# Patient Record
Sex: Female | Born: 1980
Health system: Southern US, Community
[De-identification: ages and names within clinical notes are randomized; demographics above are authoritative.]

## PROBLEM LIST (undated history)

## (undated) ENCOUNTER — Inpatient Hospital Stay (HOSPITAL_COMMUNITY): Payer: Self-pay

## (undated) DIAGNOSIS — Z9889 Other specified postprocedural states: Secondary | ICD-10-CM

## (undated) DIAGNOSIS — O139 Gestational [pregnancy-induced] hypertension without significant proteinuria, unspecified trimester: Secondary | ICD-10-CM

## (undated) DIAGNOSIS — R51 Headache: Secondary | ICD-10-CM

## (undated) DIAGNOSIS — E663 Overweight: Secondary | ICD-10-CM

## (undated) DIAGNOSIS — R112 Nausea with vomiting, unspecified: Secondary | ICD-10-CM

## (undated) HISTORY — DX: Overweight: E66.3

---

## 1998-01-21 ENCOUNTER — Emergency Department (HOSPITAL_COMMUNITY): Admission: EM | Admit: 1998-01-21 | Discharge: 1998-01-21 | Payer: Self-pay | Admitting: Emergency Medicine

## 2000-04-13 ENCOUNTER — Encounter: Admission: RE | Admit: 2000-04-13 | Discharge: 2000-04-13 | Payer: Self-pay

## 2000-11-27 ENCOUNTER — Other Ambulatory Visit: Admission: RE | Admit: 2000-11-27 | Discharge: 2000-11-27 | Payer: Self-pay | Admitting: Internal Medicine

## 2000-12-01 ENCOUNTER — Other Ambulatory Visit: Admission: RE | Admit: 2000-12-01 | Discharge: 2000-12-01 | Payer: Self-pay | Admitting: Internal Medicine

## 2002-04-22 ENCOUNTER — Emergency Department (HOSPITAL_COMMUNITY): Admission: EM | Admit: 2002-04-22 | Discharge: 2002-04-22 | Payer: Self-pay | Admitting: Emergency Medicine

## 2004-03-10 HISTORY — PX: WISDOM TOOTH EXTRACTION: SHX21

## 2004-11-27 ENCOUNTER — Emergency Department (HOSPITAL_COMMUNITY): Admission: EM | Admit: 2004-11-27 | Discharge: 2004-11-27 | Payer: Self-pay | Admitting: Family Medicine

## 2005-02-04 ENCOUNTER — Other Ambulatory Visit: Admission: RE | Admit: 2005-02-04 | Discharge: 2005-02-04 | Payer: Self-pay | Admitting: Obstetrics and Gynecology

## 2006-04-27 ENCOUNTER — Ambulatory Visit (HOSPITAL_COMMUNITY): Admission: RE | Admit: 2006-04-27 | Discharge: 2006-04-27 | Payer: Self-pay | Admitting: Obstetrics and Gynecology

## 2006-07-24 ENCOUNTER — Inpatient Hospital Stay (HOSPITAL_COMMUNITY): Admission: RE | Admit: 2006-07-24 | Discharge: 2006-07-27 | Payer: Self-pay | Admitting: Obstetrics and Gynecology

## 2006-08-10 ENCOUNTER — Encounter: Payer: Self-pay | Admitting: Family Medicine

## 2007-04-21 ENCOUNTER — Emergency Department (HOSPITAL_COMMUNITY): Admission: EM | Admit: 2007-04-21 | Discharge: 2007-04-21 | Payer: Self-pay | Admitting: Emergency Medicine

## 2007-05-28 ENCOUNTER — Ambulatory Visit: Payer: Self-pay | Admitting: Family Medicine

## 2007-06-11 ENCOUNTER — Encounter: Payer: Self-pay | Admitting: Family Medicine

## 2007-10-27 ENCOUNTER — Encounter: Payer: Self-pay | Admitting: Family Medicine

## 2007-10-27 ENCOUNTER — Encounter: Payer: Self-pay | Admitting: *Deleted

## 2007-10-27 DIAGNOSIS — R1011 Right upper quadrant pain: Secondary | ICD-10-CM | POA: Insufficient documentation

## 2007-10-28 ENCOUNTER — Encounter: Admission: RE | Admit: 2007-10-28 | Discharge: 2007-10-28 | Payer: Self-pay | Admitting: Family Medicine

## 2008-04-06 ENCOUNTER — Ambulatory Visit: Payer: Self-pay | Admitting: Family Medicine

## 2008-04-06 DIAGNOSIS — J329 Chronic sinusitis, unspecified: Secondary | ICD-10-CM | POA: Insufficient documentation

## 2008-06-28 ENCOUNTER — Ambulatory Visit: Payer: Self-pay | Admitting: Family Medicine

## 2008-06-28 DIAGNOSIS — R300 Dysuria: Secondary | ICD-10-CM | POA: Insufficient documentation

## 2008-06-28 LAB — CONVERTED CEMR LAB
Bilirubin Urine: NEGATIVE
Ketones, urine, test strip: NEGATIVE
Nitrite: NEGATIVE
Specific Gravity, Urine: 1.01
Urobilinogen, UA: 0.2

## 2008-12-11 ENCOUNTER — Ambulatory Visit: Payer: Self-pay | Admitting: Family Medicine

## 2008-12-11 DIAGNOSIS — J309 Allergic rhinitis, unspecified: Secondary | ICD-10-CM | POA: Insufficient documentation

## 2009-06-19 ENCOUNTER — Ambulatory Visit: Payer: Self-pay | Admitting: Sports Medicine

## 2009-06-19 DIAGNOSIS — M542 Cervicalgia: Secondary | ICD-10-CM | POA: Insufficient documentation

## 2010-02-28 ENCOUNTER — Ambulatory Visit: Payer: Self-pay | Admitting: Family Medicine

## 2010-04-01 ENCOUNTER — Encounter: Payer: Self-pay | Admitting: Family Medicine

## 2010-04-01 ENCOUNTER — Ambulatory Visit: Admit: 2010-04-01 | Payer: Self-pay

## 2010-04-01 ENCOUNTER — Ambulatory Visit
Admission: RE | Admit: 2010-04-01 | Discharge: 2010-04-01 | Payer: Self-pay | Source: Home / Self Care | Attending: Family Medicine | Admitting: Family Medicine

## 2010-04-02 NOTE — Progress Notes (Unsigned)
Subjective:     Kelsey [name] is a 30 y.o. female who presents for evaluation of sinus pain. Symptoms include: clear rhinorrhea, congestion, facial pain, frequent clearing of the throat, headaches and nasal congestion. Onset of symptoms was 4 weeks ago. Symptoms have been gradually worsening since that time. Past history is significant for no history of pneumonia or bronchitis. Patient is a non-smoker.  The following portions of the patient's history were reviewed and updated as appropriate: past family history, past social history, past surgical history and problem list.  Review of Systems Ears, nose, mouth, throat, and face: positive for facial pain   Objective:    Head: Normocephalic, without obvious abnormality, asymmetric shape, atraumatic  TTP left maxillary sinus. OP clear. Lungs CTA   Assessment:    Acute bacterial sinusitis.    Plan:    clarithromucin and steroids.

## 2010-04-09 NOTE — Assessment & Plan Note (Signed)
Summary: NECK PAIN x 4 DYS   Vital Signs:  Patient profile:   30 year old female Height:      61 inches Weight:      123 pounds BMI:     23.32 BP sitting:   100 / 68  Vitals Entered By: Lillia Pauls CMA (June 19, 2009 8:33 AM)  Primary Provider:  Denny Levy MD   History of Present Illness: 30 year old who comes in with neck pain for about the last week without any known trauma. No radiculopathy, no numbness, tingling, or weakness.   No hx of neck pain. No h/o surgery.  No shoulder pain  focal around post R parascapular  and R post neck   REVIEW OF SYSTEMS  GEN: No systemic complaints, no fevers, chills, sweats, or other acute illnesses MSK: Detailed in the HPI GI: tolerating PO intake without difficulty Neuro: No numbness, parasthesias, or tingling associated. Otherwise the pertinent positives of the ROS are noted above.       Allergies: 1)  ! Sulfa  Past History:  Past medical, surgical, family and social histories (including risk factors) reviewed, and no changes noted (except as noted below).  Past Medical History: Reviewed history from 05/28/2007 and no changes required. intermittent allergic rhinitis  Past Surgical History: Reviewed history from 06/11/2007 and no changes required. C section 2008  Family History: Reviewed history from 06/11/2007 and no changes required. 3 brothers alive and well Mother died at 77 lung cancer Father hi cholesterol  Social History: Reviewed history and no changes required.  Physical Exam  General:  GEN: Well-developed,well-nourished,in no acute distress; alert,appropriate and cooperative throughout examination HEENT: Normocephalic and atraumatic without obvious abnormalities. No apparent alopecia or balding. Ears, externally no deformities PULM: Breathing comfortably in no respiratory distress EXT: No clubbing, cyanosis, or edema PSYCH: Normally interactive. Cooperative during the interview. Pleasant. Friendly and  conversant. Not anxious or depressed appearing. Normal, full affect.  Msk:  Cervical spine: mild loss of ROM at terminal motion, neg spurlings  Some TTP at R paraspinous around c3 and upper trap, spasm   Impression & Recommendations:  Problem # 1:  NECK PAIN (ICD-723.1) Assessment New acute paraspinous spasm and parascapular  Moist heat, massage Reviewed ergs Neck and scapular protocol for rehab reviewed  Her updated medication list for this problem includes:    Diclofenac Sodium 75 Mg Tbec (Diclofenac sodium) .Marland Kitchen... 1 by mouth two times a day    Tizanidine Hcl 4 Mg Tabs (Tizanidine hcl) .Marland Kitchen... 1 by mouth 4 times daily  Complete Medication List: 1)  Fluticasone Propionate 50 Mcg/act Susp (Fluticasone propionate) .... One spray in each nostril two times a day x 1 week then once daily thereafter. 2)  Diclofenac Sodium 75 Mg Tbec (Diclofenac sodium) .Marland Kitchen.. 1 by mouth two times a day 3)  Tizanidine Hcl 4 Mg Tabs (Tizanidine hcl) .Marland Kitchen.. 1 by mouth 4 times daily  Patient Instructions: 1)  Review handouts and rehab  Prescriptions: TIZANIDINE HCL 4 MG TABS (TIZANIDINE HCL) 1 by mouth 4 times daily  #30 x 1   Entered and Authorized by:   Hannah Beat MD   Signed by:   Hannah Beat MD on 06/19/2009   Method used:   Print then Give to Patient   RxID:   [id number] DICLOFENAC SODIUM 75 MG TBEC (DICLOFENAC SODIUM) 1 by mouth two times a day  #60 x 1   Entered and Authorized by:   Hannah Beat MD   Signed by:  Hannah Beat MD on 06/19/2009   Method used:   Print then Give to Patient   RxID:   [id number] TIZANIDINE HCL 4 MG TABS (TIZANIDINE HCL) 1 by mouth 4 times daily  #30 x 1   Entered and Authorized by:   Hannah Beat MD   Signed by:   Hannah Beat MD on 06/19/2009   Method used:   Electronically to        [facility]* (retail)       [address]       [address] Shipping/mailing       [place], [place]  [address]       Ph:  [phone]       Fax: [phone]   RxID:   [id number] DICLOFENAC SODIUM 75 MG TBEC (DICLOFENAC SODIUM) 1 by mouth two times a day  #60 x 1   Entered and Authorized by:   Hannah Beat MD   Signed by:   Hannah Beat MD on 06/19/2009   Method used:   Electronically to        [facility]* (retail)       [address]       [address] Shipping/mailing       [place], [place]  [address]       Ph: [phone]       Fax: [phone]   RxID:   [id number]

## 2010-04-09 NOTE — Assessment & Plan Note (Signed)
Summary: pressure in ear,tcb   Vital Signs:  Patient profile:   30 year old female Weight:      131 pounds Temp:     98.7 degrees F oral Pulse rate:   74 / minute BP sitting:   116 / 77  (right arm)  Vitals Entered By: Arlyss Repress CMA, (December 11, 2008 11:25 AM) CC: left ear pressure and fullness x 4 days. pain during night. Is Patient Diabetic? No Pain Assessment Patient in pain? no        Primary Care Provider:  Denny Levy MD  CC:  left ear pressure and fullness x 4 days. pain during night..  History of Present Illness: 30 year old with:   1. left ear fullness: x 4-5 days, worse at night, + PMHx seasonal allergies, Rx flonase most days. she is concerned because she will be flying this weekend. she denies HA, vision changes, sore throat, sinus pressure, cough, wheeze, fever/chills, N/V/D. has taken Allegra and Claritin in the past with relief. goal for today's visit is to make sure that she does not have ear infection. no PMHx ear infections since childhood.  Habits & Providers  Alcohol-Tobacco-Diet     Tobacco Status: never  Current Medications (verified): 1)  Fluticasone Propionate 50 Mcg/act Susp (Fluticasone Propionate) .... One Spray in Each Nostril Two Times A Day X 1 Week Then Once Daily Thereafter.  Allergies (verified): 1)  ! Sulfa PMH-FH-SH reviewed for relevance  Review of Systems       per HPI, otherwise negative  Physical Exam  General:  Well-developed, well-nourished, in no acute distress; alert, appropriate and cooperative throughout examination. Vitals reviewed. Head:  Normocephalic and atraumatic without obvious abnormalities.  Eyes:  No corneal or conjunctival inflammation noted. EOMI. Perrla. Vision grossly normal. Ears:  External ear exam shows no significant lesions or deformities.  Otoscopic examination reveals clear canals, tympanic membranes are intact bilaterally without bulging, retraction, inflammation or discharge. Hearing is grossly  normal bilaterally. Nose:  Pale and boggy turbinates, clear nasal drainage. Mouth:  Oral mucosa and oropharynx without lesions or exudates.  Teeth in good repair. Neck:  No deformities, masses, or tenderness noted.   Impression & Recommendations:  Problem # 1:  ALLERGIC RHINITIS, SEASONAL (ICD-477.0) Assessment Deteriorated  No red flags. Reassured patient that there is no infection or need for Abx. Rec: adding OTC antihistamine while seasonal allergies are worst. Symptomatic treatment while flying (chewing gum), Motrin.  Orders: [facility]- Est Level  3 ([address])  Complete Medication List: 1)  Fluticasone Propionate 50 Mcg/act Susp (Fluticasone propionate) .... One spray in each nostril two times a day x 1 week then once daily thereafter.  Patient Instructions: 1)  It was nice to see you! 2)  Have a safe trip! 3)  Add an OTC antihistamine (Claritin, Zyrtec, Allegra) to your nasal spray while your allergies are bad.

## 2010-04-11 NOTE — Assessment & Plan Note (Signed)
Summary: sinus infection,mc   Primary Care Provider:  Denny Levy MD   History of Present Illness: 30 y.o.  Kelsey [name] is a 30 y.o. female who presents for evaluation of sinus pain. Symptoms include: clear rhinorrhea, congestion, facial pain, frequent clearing of the throat, headaches and nasal congestion. Onset of symptoms was 4 weeks ago. Symptoms have been gradually worsening since that time. Past history is significant for no history of pneumonia or bronchitis. Patient is a non-smoker.  The following portions of the patient's history were reviewed and updated as appropriate: past family history, past social history, past surgical history and problem list.  Review of Systems Ears, nose, mouth, throat, and face: positive for facial pain   Objective:   Head: Normocephalic, without obvious abnormality, asymmetric shape, atraumatic  TTP left maxillary sinus. OP clear. Lungs CTA   Assessment:   Acute bacterial sinusitis.    Plan:   clarithromucin and steroids.   Allergies: 1)  ! Sulfa   Impression & Recommendations:  Problem # 1:  SINUSITIS, ACUTE (ICD-461.9) not resolving after 4 weeks  Complete Medication List: 1)  Fluticasone Propionate 50 Mcg/act Susp (Fluticasone propionate) .... One spray in each nostril two times a day x 1 week then once daily thereafter. 2)  Diclofenac Sodium 75 Mg Tbec (Diclofenac sodium) .Marland Kitchen.. 1 by mouth two times a day 3)  Tizanidine Hcl 4 Mg Tabs (Tizanidine hcl) .Marland Kitchen.. 1 by mouth 4 times daily 4)  Augmentin 500-125 Mg Tabs (Amoxicillin-pot clavulanate) .... Take one tab two times a day for 10 days 5)  Prednisone 50 Mg Tabs (Prednisone) .Marland Kitchen.. 1 by mouth once daily for five days 6)  Biaxin Xl 500 Mg Xr24h-tab (Clarithromycin) .... 2 by mouth qd Prescriptions: BIAXIN XL 500 MG XR24H-TAB (CLARITHROMYCIN) 2 by mouth qd  #14 x 1   Entered and Authorized by:   Denny Levy MD   Signed by:   Denny Levy MD on 04/01/2010   Method used:   Electronically to      [facility]* (retail)       [address]       [address] Shipping/mailing       [place], [place]  [address]       Ph: [phone]       Fax: [phone]   RxID:   [id number] PREDNISONE 50 MG TABS (PREDNISONE) 1 by mouth once daily for five days  #5 x 0   Entered and Authorized by:   Denny Levy MD   Signed by:   Denny Levy MD on 04/01/2010   Method used:   Electronically to        [facility]* (retail)       [address]       [address] Shipping/mailing       [place], [place]  [address]       Ph: [phone]       Fax: [phone]   RxID:   [id number]    Orders Added: 1)  Est. Patient Level IV [[address]]     Impression & Recommendations:  Her updated medication list for this problem includes:    Fluticasone Propionate 50 Mcg/act Susp (Fluticasone propionate) ..... One spray in each nostril two times a day x 1 week then once daily thereafter.    Augmentin 500-125 Mg Tabs (Amoxicillin-pot clavulanate) .Marland Kitchen... Take one tab two times a day for 10 days    Biaxin Xl 500 Mg Xr24h-tab (  Clarithromycin) .Marland Kitchen... 2 by mouth qd   Complete Medication List: 1)  Fluticasone Propionate 50 Mcg/act Susp (Fluticasone propionate) .... One spray in each nostril two times a day x 1 week then once daily thereafter. 2)  Diclofenac Sodium 75 Mg Tbec (Diclofenac sodium) .Marland Kitchen.. 1 by mouth two times a day 3)  Tizanidine Hcl 4 Mg Tabs (Tizanidine hcl) .Marland Kitchen.. 1 by mouth 4 times daily 4)  Augmentin 500-125 Mg Tabs (Amoxicillin-pot clavulanate) .... Take one tab two times a day for 10 days 5)  Prednisone 50 Mg Tabs (Prednisone) .Marland Kitchen.. 1 by mouth once daily for five days 6)  Biaxin Xl 500 Mg Xr24h-tab (Clarithromycin) .... 2 by mouth qd

## 2010-04-11 NOTE — Assessment & Plan Note (Signed)
Summary: Sinus Infection/kf   Vital Signs:  Patient profile:   30 year old female Height:      61 inches Weight:      129 pounds BMI:     24.46 Temp:     98.4 degrees F oral Pulse rate:   82 / minute BP sitting:   115 / 56  (left arm) Cuff size:   regular  Vitals Entered By: Tessie Fass CMA (February 28, 2010 2:22 PM) CC: sinus infection   Primary Care Lilliana Turner:  Denny Levy MD  CC:  sinus infection.  History of Present Illness: 4 days of sinus pressure, post nasal drip.  hard to sleep b/c of the post nasal drip.  taking mucinex and delsym.  denies fever, chills, sore throat.  Current Medications (verified): 1)  Fluticasone Propionate 50 Mcg/act Susp (Fluticasone Propionate) .... One Spray in Each Nostril Two Times A Day X 1 Week Then Once Daily Thereafter. 2)  Diclofenac Sodium 75 Mg Tbec (Diclofenac Sodium) .Marland Kitchen.. 1 By Mouth Two Times A Day 3)  Tizanidine Hcl 4 Mg Tabs (Tizanidine Hcl) .Marland Kitchen.. 1 By Mouth 4 Times Daily 4)  Augmentin 500-125 Mg Tabs (Amoxicillin-Pot Clavulanate) .... Take One Tab Two Times A Day For 10 Days  Allergies (verified): 1)  ! Sulfa  Review of Systems  The patient denies anorexia, fever, hoarseness, prolonged cough, and headaches.    Physical Exam  General:  NAD vs reviewed Head:  normocephalic and atraumatic.  tender to percussion of maxillary sinuses Ears:  R ear normal and L ear normal.   Nose:  no external erythema and no nasal discharge.   Lungs:  Normal respiratory effort, chest expands symmetrically. Lungs are clear to auscultation, no crackles or wheezes.   Impression & Recommendations:  Problem # 1:  SINUSITIS, CHRONIC (ICD-473.9) Assessment Deteriorated will treat with nasal steroid and conservative comfort measures.  gave script for abx as pt is leaving town.  advised to use if continues to worsen/fever/lasts longer than ten days.  sudafed, tylenol recommended Her updated medication list for this problem includes:    Fluticasone  Propionate 50 Mcg/act Susp (Fluticasone propionate) ..... One spray in each nostril two times a day x 1 week then once daily thereafter.    Augmentin 500-125 Mg Tabs (Amoxicillin-pot clavulanate) .Marland Kitchen... Take one tab two times a day for 10 days  Orders: [facility]- Est Level  3 ([address])  Complete Medication List: 1)  Fluticasone Propionate 50 Mcg/act Susp (Fluticasone propionate) .... One spray in each nostril two times a day x 1 week then once daily thereafter. 2)  Diclofenac Sodium 75 Mg Tbec (Diclofenac sodium) .Marland Kitchen.. 1 by mouth two times a day 3)  Tizanidine Hcl 4 Mg Tabs (Tizanidine hcl) .Marland Kitchen.. 1 by mouth 4 times daily 4)  Augmentin 500-125 Mg Tabs (Amoxicillin-pot clavulanate) .... Take one tab two times a day for 10 days  Patient Instructions: 1)  I'm sorry you are feeling poorly, especially over the holidays. 2)  Please try the nasal steroid until 7 days after you feel better. 3)  Also, I will give you a perscription for antibiotics to take if you get a fever, feel worse, or are no better in 10 days. 4)  Acute sinusitis symptoms for less than 10 days are not helped by antibiotics. Use warm moist compresses, and over the counter decongestants( only as directed). Call if no improvement in 5-7 days, sooner if increasing pain, fever, or new symptoms.  Prescriptions: AUGMENTIN 500-125 MG TABS (AMOXICILLIN-POT CLAVULANATE)  take one tab two times a day for 10 days  #20 x 0   Entered and Authorized by:   Ellery Plunk MD   Signed by:   Ellery Plunk MD on 02/28/2010   Method used:   Print then Give to Patient   RxID:   [id number] FLUTICASONE PROPIONATE 50 MCG/ACT SUSP (FLUTICASONE PROPIONATE) One spray in each nostril two times a day x 1 week then once daily thereafter.  #1 x 6   Entered and Authorized by:   Ellery Plunk MD   Signed by:   Ellery Plunk MD on 02/28/2010   Method used:   Electronically to        [facility]* (retail)       [address]       [address] Shipping/mailing       [place], [place]  [address]       Ph: [phone]       Fax: [phone]   RxID:   [id number]    Orders Added: 1)  [facility]- Est Level  3 [[address]]

## 2010-04-18 ENCOUNTER — Encounter: Payer: Self-pay | Admitting: *Deleted

## 2010-04-29 ENCOUNTER — Encounter: Payer: Self-pay | Admitting: Family Medicine

## 2010-04-29 ENCOUNTER — Ambulatory Visit (INDEPENDENT_AMBULATORY_CARE_PROVIDER_SITE_OTHER): Payer: BC Managed Care – PPO | Admitting: Family Medicine

## 2010-04-29 DIAGNOSIS — M654 Radial styloid tenosynovitis [de Quervain]: Secondary | ICD-10-CM

## 2010-05-07 NOTE — Assessment & Plan Note (Signed)
Summary: CARPAL TUNNEL SYMPTOMS   Vital Signs:  Patient profile:   30 year old female Pulse rate:   82 / minute BP sitting:   104 / 68  (right arm)  Vitals Entered By: Rochele Pages RN (April 29, 2010 4:14 PM) CC: bilat wrist pain- radial side   Primary Care Provider:  Denny Levy MD  CC:  bilat wrist pain- radial side.  History of Present Illness: B thumb / wrist pain worsening over last few weeks. Types a lot but no change in tthat. pain is along radial side of thumb, right worse than left. aching and some burning.   PERTINENT PMH/PSH: no specific injury, no prior issues w thumbs or wrists. no prior surgeries thumbs or wrists.  Habits & Providers  Alcohol-Tobacco-Diet     Tobacco Status: never  Current Medications (verified): 1)  Fluticasone Propionate 50 Mcg/act Susp (Fluticasone Propionate) .... One Spray in Each Nostril Two Times A Day X 1 Week Then Once Daily Thereafter. 2)  Diclofenac Sodium 75 Mg Tbec (Diclofenac Sodium) .Marland Kitchen.. 1 By Mouth Two Times A Day 3)  Tizanidine Hcl 4 Mg Tabs (Tizanidine Hcl) .Marland Kitchen.. 1 By Mouth 4 Times Daily 4)  Augmentin 500-125 Mg Tabs (Amoxicillin-Pot Clavulanate) .... Take One Tab Two Times A Day For 10 Days 5)  Prednisone 50 Mg Tabs (Prednisone) .Marland Kitchen.. 1 By Mouth Once Daily For Five Days 6)  Biaxin Xl 500 Mg Xr24h-Tab (Clarithromycin) .... 2 By Mouth Qd  Allergies: 1)  ! Sulfa  Review of Systems  The patient denies fever and weight loss.    Physical Exam  General:  alert, well-developed, well-nourished, and well-hydrated.   Msk:  B thumbs FROM. TTP along APL /EPB esp on righ. Full strength dista;;y neurovascualry intact Wrists B full strengh and FROM in all planes   Impression & Recommendations:  Problem # 1:  DE QUERVAIN'S TENOSYNOVITIS (ICD-727.04) B R>L exercises and RICE instructions. f/u if not improving  Complete Medication List: 1)  Fluticasone Propionate 50 Mcg/act Susp (Fluticasone propionate) .... One spray in each  nostril two times a day x 1 week then once daily thereafter. 2)  Diclofenac Sodium 75 Mg Tbec (Diclofenac sodium) .Marland Kitchen.. 1 by mouth two times a day 3)  Tizanidine Hcl 4 Mg Tabs (Tizanidine hcl) .Marland Kitchen.. 1 by mouth 4 times daily 4)  Augmentin 500-125 Mg Tabs (Amoxicillin-pot clavulanate) .... Take one tab two times a day for 10 days 5)  Prednisone 50 Mg Tabs (Prednisone) .Marland Kitchen.. 1 by mouth once daily for five days 6)  Biaxin Xl 500 Mg Xr24h-tab (Clarithromycin) .... 2 by mouth qd   Orders Added: 1)  Est. Patient Level III [78295]

## 2010-05-13 ENCOUNTER — Encounter: Payer: Self-pay | Admitting: Family Medicine

## 2010-05-13 ENCOUNTER — Ambulatory Visit (INDEPENDENT_AMBULATORY_CARE_PROVIDER_SITE_OTHER): Payer: BC Managed Care – PPO | Admitting: Family Medicine

## 2010-05-13 DIAGNOSIS — M654 Radial styloid tenosynovitis [de Quervain]: Secondary | ICD-10-CM

## 2010-05-21 NOTE — Assessment & Plan Note (Signed)
Summary: APPT 2:15 WRIST F/U,[facility]   Vital Signs:  Patient profile:   30 year old female BP sitting:   106 / 74  Vitals Entered By: Lillia Pauls CMA (May 13, 2010 2:08 PM)  Primary Care Provider:  Denny Levy MD   History of Present Illness: f/u B R>L thumb pain. Absolutely7 no better wit the rest (and she was on vacation so it was true rest).Pain is aching 4/10 at worst.  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  Allergies: 1)  ! Sulfa  Review of Systems  The patient denies fever.    Physical Exam  General:  alert, well-developed, well-nourished, and well-hydrated.   Msk:  + Finklesteins B TTP APL and PBr R> L intact thumb strength and rom Additional Exam:  Patient given informed consent for injection. Discussed possible complications of infection, bleeding or skin atrophy at site of injection. Possible side effect of avascular necrosis (focal area of bone death) due to steroid use.Appropriate verbal time out taken Are cleaned and prepped in usual sterile fashion. A --1/2-- cc kennalog plus --1--cc 1% lidocaine without epinephrine was injected into the-tendon sheath of APL ?PBR rigt--. Patient tolerated procedure well with no complications.    Impression & Recommendations:  Problem # 1:  DE QUERVAIN'S TENOSYNOVITIS (ICD-727.04)  Orders: Thumb Splint (Z6109) Joint Aspirate / Injection, Small (60454) Kenalog 10 mg inj (J3301) right injection thumb splint ice rtc 1-2 w  Complete Medication List: 1)  Fluticasone Propionate 50 Mcg/act Susp (Fluticasone propionate) .... One spray in each nostril two times a day x 1 week then once daily thereafter. 2)  Diclofenac Sodium 75 Mg Tbec (Diclofenac sodium) .Marland Kitchen.. 1 by mouth two times a day 3)  Tizanidine Hcl 4 Mg Tabs (Tizanidine hcl) .Marland Kitchen.. 1 by mouth 4 times daily 4)  Augmentin 500-125 Mg Tabs (Amoxicillin-pot clavulanate) .... Take one tab two  times a day for 10 days 5)  Prednisone 50 Mg Tabs (Prednisone) .Marland Kitchen.. 1 by mouth once daily for five days 6)  Biaxin Xl 500 Mg Xr24h-tab (Clarithromycin) .... 2 by mouth qd   Orders Added: 1)  Thumb Splint [L3999] 2)  Est. Patient Level III [09811] 3)  Joint Aspirate / Injection, Small [20600] 4)  Kenalog 10 mg inj [J3301]

## 2010-07-26 NOTE — Op Note (Signed)
[name], Kelsey [name]                  ACCOUNT NO.:  [id number]   MEDICAL RECORD NO.:  [id number]          PATIENT TYPE:  INP   LOCATION:  9147                          FACILITY:  [facility]   PHYSICIAN:  Zelphia Cairo, MD    DATE OF BIRTH:  [birth year]   DATE OF PROCEDURE:  07/24/2006  DATE OF DISCHARGE:                               OPERATIVE REPORT   PREOPERATIVE DIAGNOSES:  1. Intrauterine pregnancy at 38+ weeks.  2. Breech presentation.   POSTOPERATIVE DIAGNOSES:  1. Intrauterine pregnancy at 38+ weeks.  2. Breech presentation.   OPERATION PERFORMED:  Primary low transverse cesarean delivery.   SURGEON:  Zelphia Cairo, MD   ANESTHESIA:  Spinal.   SPECIMENS:  Placenta to labor and delivery.   ESTIMATED BLOOD LOSS:  600 mL.   COMPLICATIONS:  None.   URINE OUTPUT:  400 mL.   CONDITION:  Stable to recovery room.   DESCRIPTION OF PROCEDURE:  The patient was taken to the operating room  where spinal anesthesia was found to be adequate.  She was placed in the  supine position with a left tilt.  She was prepped and draped in sterile  fashion and a Foley catheter was inserted sterilely.  Pfannenstiel  incision was made with a scalpel and carried down to underlying fascia.  The fascia was incised in the midline and this was extended laterally  using Mayo scissors.  The Kocher clamps were then used to grasp the  anterior portion of the fascia.  This was tented upwards and dissected  off the underlying rectus muscles using a Bovie.  The inferior portion  of the fascia was then grasped, tented upwards and dissected off the  underlying rectus muscles using a Bovie.  The peritoneum was then  identified, tented upwards and entered sharply using Metzenbaum  scissors.  This was extended superiorly and inferiorly with good  visualization of the bladder.  The bladder blade was then inserted.  The  bladder flap was created using Metzenbaum scissors and the bladder blade  was replaced to  protect the bladder flap.   Uterine incision was then made with the scalpel and this was extended  bluntly.  The infant's hips were then grasped and the butt was delivered  through the uterine incision.  The knees were flexed and delivered  through the incision.  The shoulders were then delivered without problem  and the head followed.  The mouth and nose of the infant were suctioned  as the cord was clamped and cut.  The infant was then taken to the  waiting pediatrics staff.   The placenta was then removed from the uterus.  The uterus was cleared  of all clots and debris using a lap sponge.  The uterine incision was  closed with 1-0 chromic in a running locked fashion.  The uterine  incision was found to be hemostatic.  The pelvis was then irrigated with  warm normal saline.  The peritoneum was reapproximated with 0 Monocryl.  The fascia was closed with PDS and the skin was closed with staples.  The patient tolerated  the procedure well.  Sponge, lap, needle and  instruments were all correct times two.  She received antibiotics at  cord clamp.      Zelphia Cairo, MD  Electronically Signed     GA/MEDQ  D:  07/24/2006  T:  07/24/2006  Job:  [id number]

## 2010-07-26 NOTE — Discharge Summary (Signed)
NAMEMERLIN, EGE                  ACCOUNT NO.:  [id number]   MEDICAL RECORD NO.:  [id number]          PATIENT TYPE:  INP   LOCATION:  9147                          FACILITY:  [facility]   PHYSICIAN:  Duke Salvia. Marcelle Overlie, M.D.DATE OF BIRTH:  [birth year]   DATE OF ADMISSION:  07/24/2006  DATE OF DISCHARGE:  07/27/2006                               DISCHARGE SUMMARY   ADMISSION DIAGNOSES:  1. Intrauterine pregnancy at term.  2. Breech presentation.   DISCHARGE DIAGNOSIS:  Status post low transverse cesarean section, a  viable female infant.   PROCEDURE:  Primary low transverse cesarean section.   REASON FOR ADMISSION:  Please see written H&P.   HOSPITAL COURSE:  The patient is 25-year primigravida that was admitted  to [facility] at 30 8-6/7 weeks estimated gestational  age with a breech presenting station.  Due to the breech presentation.  The patient elected to proceed with a primary low transverse cesarean  section. On the morning of admission the patient was taken to operating  room where spinal anesthesia was administered without difficulty.  Low  transverse incision was made with delivery of a viable female infant  weighing 7 pounds 15 ounces Apgars of 8 at 1 and 9 at 5 minutes.  The  patient tolerated procedure well and taken to the recovery room in  stable condition.  On postoperative day #1 the patient was without  complaint.  Vital signs were stable.  She was afebrile.  Abdomen soft  with good return of bowel function.  Abdominal dressing had to be clean,  dry and intact.  Laboratory findings revealed hemoglobin of 11.7,  platelet count 176,000, WBC count of 11.1. On postoperative day #2 the  patient was without complaint.  Vital signs were stable.  Abdomen soft.  Fundus firm and nontender.  Incision was clean, dry and intact.  The  patient was ambulating well and tolerating a regular diet without  complaints of nausea vomiting. On postoperative day #3 the patient  was  without complaint.  Vital signs remained stable.  She is afebrile.  Abdomen soft.  Fundus firm and nontender.  Incision was clean, dry and  intact.  Staples removed.  The patient was later discharged home.   CONDITION ON DISCHARGE:  Good, diet regular as tolerated.   ACTIVITY:  No heavy lifting, no driving x2 weeks, no vaginal entry.   FOLLOW UP:  Patient to follow up in the office in 1-2 weeks for an  incision check.  She is to call for temperature greater than 100  degrees, persistent nausea, vomiting, heavy vaginal bleeding and/or  redness or drainage from incisional site.   DISCHARGE MEDICATIONS:  1. Tylox #30 one p.o. every 4-6 hours.  2. Motrin 600 mg every 6 hours.  3. Prenatal vitamins one p.o. daily.  4. Colace one p.o. daily.      Julio Sicks, N.P.      Richard M. Marcelle Overlie, M.D.  Electronically Signed    CC/MEDQ  D:  09/06/2006  T:  09/07/2006  Job:  [id number]

## 2010-10-04 ENCOUNTER — Encounter (HOSPITAL_COMMUNITY)
Admission: RE | Disposition: A | Discharge status: Home / Self Care | Payer: Self-pay | Source: Ambulatory Visit | Attending: Obstetrics and Gynecology

## 2010-10-04 ENCOUNTER — Encounter (HOSPITAL_COMMUNITY): Payer: Self-pay | Admitting: Anesthesiology

## 2010-10-04 ENCOUNTER — Other Ambulatory Visit: Payer: Self-pay | Admitting: Obstetrics and Gynecology

## 2010-10-04 ENCOUNTER — Encounter (HOSPITAL_COMMUNITY): Payer: Self-pay

## 2010-10-04 ENCOUNTER — Ambulatory Visit (HOSPITAL_COMMUNITY)
Admission: RE | Admit: 2010-10-04 | Discharge: 2010-10-04 | Disposition: A | Discharge status: Home / Self Care | Payer: BC Managed Care – PPO | Source: Ambulatory Visit | Attending: Obstetrics and Gynecology | Admitting: Obstetrics and Gynecology

## 2010-10-04 ENCOUNTER — Ambulatory Visit (HOSPITAL_COMMUNITY): Payer: BC Managed Care – PPO | Admitting: Anesthesiology

## 2010-10-04 DIAGNOSIS — O021 Missed abortion: Secondary | ICD-10-CM | POA: Insufficient documentation

## 2010-10-04 HISTORY — DX: Nausea with vomiting, unspecified: R11.2

## 2010-10-04 HISTORY — PX: DILATION AND EVACUATION: SHX1459

## 2010-10-04 HISTORY — DX: Other specified postprocedural states: Z98.890

## 2010-10-04 LAB — CBC
HCT: 36.1 % (ref 36.0–46.0)
MCH: 32 pg (ref 26.0–34.0)
MCV: 90.3 fL (ref 78.0–100.0)
RBC: 4 MIL/uL (ref 3.87–5.11)

## 2010-10-04 SURGERY — DILATION AND EVACUATION, UTERUS
Anesthesia: Monitor Anesthesia Care | Site: Uterus | Wound class: Clean Contaminated

## 2010-10-04 MED ORDER — DOXYCYCLINE HYCLATE 100 MG PO TABS: 100.0000 mg | ORAL_TABLET | Freq: Two times a day (BID) | ORAL | Status: DC

## 2010-10-04 MED ORDER — PROPOFOL 10 MG/ML IV EMUL
INTRAVENOUS | Status: DC | PRN
  Administered 2010-10-04: 200 ug/kg/min via INTRAVENOUS

## 2010-10-04 MED ORDER — METOCLOPRAMIDE HCL 10 MG PO TABS: 10.0000 mg | ORAL_TABLET | Freq: Once | ORAL | Status: DC | PRN

## 2010-10-04 MED ORDER — METHYLERGONOVINE MALEATE 0.2 MG PO TABS: 0.2000 mg | ORAL_TABLET | Freq: Three times a day (TID) | ORAL | Status: DC

## 2010-10-04 MED ORDER — LIDOCAINE HCL (CARDIAC) 20 MG/ML IV SOLN
INTRAVENOUS | Status: DC | PRN
  Administered 2010-10-04: 50 mg via INTRAVENOUS

## 2010-10-04 MED ORDER — FENTANYL CITRATE 0.05 MG/ML IJ SOLN: 25.0000 ug | INTRAMUSCULAR | Status: DC | PRN

## 2010-10-04 MED ORDER — CHLOROPROCAINE HCL 1 % IJ SOLN
INTRAMUSCULAR | Status: DC | PRN
  Administered 2010-10-04: 10 mL

## 2010-10-04 MED ORDER — KETOROLAC TROMETHAMINE 30 MG/ML IJ SOLN
INTRAMUSCULAR | Status: DC | PRN
  Administered 2010-10-04: 30 mg via INTRAVENOUS

## 2010-10-04 MED ORDER — DEXTROSE-NACL 5-0.9 % IV SOLN: INTRAVENOUS | Status: DC

## 2010-10-04 MED ORDER — FENTANYL CITRATE 0.05 MG/ML IJ SOLN
INTRAMUSCULAR | Status: DC | PRN
  Administered 2010-10-04: 100 ug via INTRAVENOUS

## 2010-10-04 MED ORDER — LIDOCAINE HCL (CARDIAC) 20 MG/ML IV SOLN
INTRAVENOUS | Status: AC
  Filled 2010-10-04: qty 5

## 2010-10-04 MED ORDER — MIDAZOLAM HCL 5 MG/5ML IJ SOLN
INTRAMUSCULAR | Status: DC | PRN
  Administered 2010-10-04: 2 mg via INTRAVENOUS

## 2010-10-04 MED ORDER — SCOPOLAMINE 1 MG/3DAYS TD PT72: 1.0000 | MEDICATED_PATCH | Freq: Once | TRANSDERMAL | Status: DC | PRN

## 2010-10-04 MED ORDER — DEXAMETHASONE SODIUM PHOSPHATE 10 MG/ML IJ SOLN
INTRAMUSCULAR | Status: AC
  Filled 2010-10-04: qty 1

## 2010-10-04 MED ORDER — MIDAZOLAM HCL 2 MG/2ML IJ SOLN
INTRAMUSCULAR | Status: AC
  Filled 2010-10-04: qty 2

## 2010-10-04 MED ORDER — FENTANYL CITRATE 0.05 MG/ML IJ SOLN
INTRAMUSCULAR | Status: AC
  Filled 2010-10-04: qty 2

## 2010-10-04 MED ORDER — DEXAMETHASONE SODIUM PHOSPHATE 10 MG/ML IJ SOLN
INTRAMUSCULAR | Status: DC | PRN
  Administered 2010-10-04: 10 mg via INTRAVENOUS

## 2010-10-04 MED ORDER — CITRIC ACID-SODIUM CITRATE 334-500 MG/5ML PO SOLN: 30.0000 mL | Freq: Once | ORAL | Status: DC | PRN

## 2010-10-04 MED ORDER — KETOROLAC TROMETHAMINE 30 MG/ML IJ SOLN
INTRAMUSCULAR | Status: AC
  Filled 2010-10-04: qty 1

## 2010-10-04 MED ORDER — HYDROCODONE-ACETAMINOPHEN 10-325 MG PO TABS: 1.0000 | ORAL_TABLET | Freq: Four times a day (QID) | ORAL | Status: AC | PRN

## 2010-10-04 MED ORDER — ONDANSETRON HCL 4 MG/2ML IJ SOLN
INTRAMUSCULAR | Status: DC | PRN
  Administered 2010-10-04: 4 mg via INTRAVENOUS

## 2010-10-04 MED ORDER — FAMOTIDINE 20 MG PO TABS: 20.0000 mg | ORAL_TABLET | Freq: Once | ORAL | Status: DC | PRN

## 2010-10-04 MED ORDER — DEXTROSE 5 % IV SOLN
1.0000 g | INTRAVENOUS | Status: AC
  Administered 2010-10-04: 1 g via INTRAVENOUS
  Filled 2010-10-04: qty 1

## 2010-10-04 MED ORDER — DOXYCYCLINE HYCLATE 100 MG PO TABS: 100.0000 mg | ORAL_TABLET | Freq: Two times a day (BID) | ORAL | Status: AC

## 2010-10-04 MED ORDER — PANTOPRAZOLE SODIUM 40 MG PO TBEC: 40.0000 mg | DELAYED_RELEASE_TABLET | Freq: Once | ORAL | Status: DC | PRN

## 2010-10-04 MED ORDER — ONDANSETRON HCL 4 MG/2ML IJ SOLN
INTRAMUSCULAR | Status: AC
  Filled 2010-10-04: qty 2

## 2010-10-04 MED ORDER — PROPOFOL 10 MG/ML IV EMUL
INTRAVENOUS | Status: AC
  Filled 2010-10-04: qty 50

## 2010-10-04 MED ORDER — KETOROLAC TROMETHAMINE 30 MG/ML IJ SOLN: 15.0000 mg | Freq: Once | INTRAMUSCULAR | Status: DC | PRN

## 2010-10-04 MED ORDER — LACTATED RINGERS IV SOLN
INTRAVENOUS | Status: DC
  Administered 2010-10-04 (×3): via INTRAVENOUS

## 2010-10-04 MED ORDER — HYDROCODONE-ACETAMINOPHEN 10-325 MG PO TABS: 1.0000 | ORAL_TABLET | Freq: Four times a day (QID) | ORAL | Status: DC | PRN

## 2010-10-04 SURGICAL SUPPLY — 21 items
CATH ROBINSON RED A/P 16FR (CATHETERS) ×2 IMPLANT
CLOTH BEACON ORANGE TIMEOUT ST (SAFETY) ×2 IMPLANT
DECANTER SPIKE VIAL GLASS SM (MISCELLANEOUS) ×2 IMPLANT
DRAPE UTILITY XL STRL (DRAPES) ×2 IMPLANT
GLOVE BIO SURGEON STRL SZ 6.5 (GLOVE) ×2 IMPLANT
GLOVE BIOGEL PI IND STRL 7.0 (GLOVE) ×1 IMPLANT
GLOVE BIOGEL PI INDICATOR 7.0 (GLOVE) ×1
GOWN PREVENTION PLUS LG XLONG (DISPOSABLE) ×4 IMPLANT
KIT BERKELEY 1ST TRIMESTER 3/8 (MISCELLANEOUS) ×2 IMPLANT
NDL SPNL 22GX3.5 QUINCKE BK (NEEDLE) ×1 IMPLANT
NEEDLE SPNL 22GX3.5 QUINCKE BK (NEEDLE) ×2 IMPLANT
NS IRRIG 1000ML POUR BTL (IV SOLUTION) ×2 IMPLANT
PACK VAGINAL MINOR WOMEN LF (CUSTOM PROCEDURE TRAY) ×2 IMPLANT
PAD PREP 24X48 CUFFED NSTRL (MISCELLANEOUS) ×2 IMPLANT
SET BERKELEY SUCTION TUBING (SUCTIONS) ×2 IMPLANT
SYR CONTROL 10ML LL (SYRINGE) ×2 IMPLANT
TOWEL OR 17X24 6PK STRL BLUE (TOWEL DISPOSABLE) ×4 IMPLANT
VACURETTE 10 RIGID CVD (CANNULA) IMPLANT
VACURETTE 7MM CVD STRL WRAP (CANNULA) IMPLANT
VACURETTE 8 RIGID CVD (CANNULA) ×1 IMPLANT
VACURETTE 9 RIGID CVD (CANNULA) IMPLANT

## 2010-10-04 NOTE — Anesthesia Postprocedure Evaluation (Signed)
  Anesthesia Post-op Note  Patient: Kelsey [name]  Procedure(s) Performed:  DILATATION AND EVACUATION (D&E) - pt last ate at 8:30am  No anesthesia complications.  Level of consciousness: alert. Cardiopulmonary status stable.  No follow-up care or observation required.  Jojo Pehl L. Rodman Pickle, MD

## 2010-10-04 NOTE — Progress Notes (Signed)
Pt discharge instructions contain medications that have been discontinued in the past. Discussed with pt that she is not to take any medications from previous list.  Pt states she has not been taking anything at home.

## 2010-10-04 NOTE — Transfer of Care (Signed)
Immediate Anesthesia Transfer of Care Note  Patient: Kelsey [name]  Procedure(s) Performed:  DILATATION AND EVACUATION (D&E) - pt last ate at 8:30am  Patient Location: PACU  Anesthesia Type: MAC  Level of Consciousness: awake, alert  and oriented  Airway & Oxygen Therapy: Patient Spontanous Breathing and Patient connected to nasal cannula oxygen  Post-op Assessment: Report given to PACU RN and Post -op Vital signs reviewed and stable  Post vital signs: Reviewed and stable  Complications: No apparent anesthesia complications

## 2010-10-04 NOTE — Op Note (Signed)
R/B/A of procedure were d/w patient and informed consent obtained.  Pt was taken to the OR w/ IV running and placed in supine position.  MAC anesthesia was obtained and she was placed in dorsal lithotomy w/ allen stirrups.  Prepped and draped with sterile procedure and in and out catheter used to drain her bladder.  Bivalved speculum was placed in vagina and 1cc of 1% nesicane injected at 12 oclock on the cervix.  Single tooth tenaculum was attached to the anterior lip of the cervix and a cervical block was performed with nesicane.  The cervix was serially dilated with pratt dilators.  8 french suction catheter was used and POC removed from uterus.  A gentle curetting was then performed until uterine cry was noted.  Suction catheter reinserted to remove clots and no more tissue noted.  Tenaculum removed and cervix was hemostatic.  Speculum removed.  All counts were correct.

## 2010-10-04 NOTE — H&P (Signed)
30 yo G2P1 at 10wks by LMP presents for surgical mngt of missed AB and anembryonic pregnancy.  PMHx/PSHx:  c-section x 1 All:  Sulfa Meds:  PNV FHx:  Cancer, heart dz  AF, VSS Gen:  NAD CV:  RRR L:  CTAB Abd - NT, soft PV:  Deferred  [place]:  + IUP at [redacted]wks gestation w/ no CM.  A/P:  Missed Ab.  [place]land for D&E.  R/B/A d/w pt and informed consent.

## 2010-10-04 NOTE — Op Note (Signed)
10/04/2010  3:34 PM  PATIENT:  Kelsey [name]  30 y.o. female  PRE-OPERATIVE DIAGNOSIS: Missed AB  POST-OPERATIVE DIAGNOSIS:  missed abortion  PROCEDURE:  Procedure(s): DILATATION AND EVACUATION (D&E)  SURGEON:  Surgeon(s): Zelphia Cairo  ANESTHESIA:   MAC and local  ESTIMATED BLOOD LOSS: minimal  BLOOD ADMINISTERED:none  DRAINS: none   LOCAL MEDICATIONS USED:  OTHER 1% Nesicane  SPECIMEN: POC  DISPOSITION OF SPECIMEN:  PATHOLOGY  COUNTS:  YES  PLAN OF CARE:d/c home  PATIENT DISPOSITION:  PACU - hemodynamically stable.   Delay start of Pharmacological VTE agent (>24hrs) due to surgical blood loss or risk of bleeding:  no

## 2010-10-04 NOTE — Anesthesia Preprocedure Evaluation (Addendum)
Anesthesia Evaluation  Name, MR# and DOB Patient awake  General Assessment Comment  Reviewed: Allergy & Precautions, H&P , Patient's Chart, lab work & pertinent test results and reviewed documented beta blocker date and time   History of Anesthesia Complications (+) PONV  Airway Mallampati: II TM Distance: >3 FB Neck ROM: Full    Dental No notable dental hx    Pulmonaryneg pulmonary ROS    clear to auscultation    Cardiovascular Regular Normal   Neuro/PsychNegative Neurological ROS Negative Psych ROS  GI/Hepatic/Renal negative GI ROS, negative Liver ROS, and negative Renal ROS (+)       Endo/Other  Negative Endocrine ROS (+)   Abdominal   Musculoskeletal  Hematology negative hematology ROS (+)   Peds negative pediatric ROS (+)  Reproductive/Obstetrics (+) Pregnancy (missed ab)   Anesthesia Other Findings             Anesthesia Physical Anesthesia Plan  ASA: II  Anesthesia Plan: MAC   Post-op Pain Management:    Induction:   Airway Management Planned:   Additional Equipment:   Intra-op Plan:   Post-operative Plan:   Informed Consent: I have reviewed the patients History and Physical, chart, labs and discussed the procedure including the risks, benefits and alternatives for the proposed anesthesia with the patient or authorized representative who has indicated his/her understanding and acceptance.   Dental advisory given  Plan Discussed with:   Anesthesia Plan Comments:       Anesthesia Quick Evaluation

## 2010-10-09 DEATH — deceased

## 2010-11-29 LAB — POCT RAPID STREP A: Streptococcus, Group A Screen (Direct): NEGATIVE

## 2010-12-03 ENCOUNTER — Encounter (HOSPITAL_COMMUNITY): Payer: Self-pay | Admitting: Obstetrics and Gynecology

## 2011-04-30 ENCOUNTER — Inpatient Hospital Stay (HOSPITAL_COMMUNITY)
Admission: AD | Admit: 2011-04-30 | Payer: BC Managed Care – PPO | Source: Ambulatory Visit | Admitting: Obstetrics and Gynecology

## 2011-06-03 LAB — OB RESULTS CONSOLE ANTIBODY SCREEN: Antibody Screen: NEGATIVE

## 2011-06-03 LAB — OB RESULTS CONSOLE HEPATITIS B SURFACE ANTIGEN: Hepatitis B Surface Ag: NEGATIVE

## 2011-06-03 LAB — OB RESULTS CONSOLE ABO/RH: RH Type: POSITIVE

## 2011-07-07 ENCOUNTER — Encounter: Payer: Self-pay | Admitting: Family Medicine

## 2011-08-12 ENCOUNTER — Other Ambulatory Visit: Payer: Self-pay | Admitting: Family Medicine

## 2011-08-12 MED ORDER — AMOXICILLIN 500 MG PO CAPS: 500.0000 mg | ORAL_CAPSULE | Freq: Three times a day (TID) | ORAL | Status: AC

## 2011-08-12 NOTE — Progress Notes (Signed)
[redacted] weeks pregnant Saw her OBGYN yesterday w US showing female  (likely) baby Things going well except 2 weeks increasing sinus pressure, frontal headache, nasal drainage , AM sore throat. No cough, no fever. PERTINENT  PMH / PSH: Chronic sinusitis EXAM: well appearing except for some dark circles under eyes TTP R>L but B maxillary sinuses, Shotty LAD anterior cervical chain Will tx for acute sinusitis w amox and she will let me know if not resolving Congratulated on healthy prehnancy

## 2011-11-19 ENCOUNTER — Other Ambulatory Visit: Payer: Self-pay | Admitting: Family Medicine

## 2011-11-19 DIAGNOSIS — R200 Anesthesia of skin: Secondary | ICD-10-CM

## 2011-11-20 ENCOUNTER — Ambulatory Visit (INDEPENDENT_AMBULATORY_CARE_PROVIDER_SITE_OTHER): Payer: BC Managed Care – PPO | Admitting: Family Medicine

## 2011-11-20 VITALS — BP 135/80 | HR 78 | Resp 12

## 2011-11-20 DIAGNOSIS — M5412 Radiculopathy, cervical region: Secondary | ICD-10-CM

## 2011-11-20 DIAGNOSIS — Z8759 Personal history of other complications of pregnancy, childbirth and the puerperium: Secondary | ICD-10-CM | POA: Insufficient documentation

## 2011-11-20 NOTE — Progress Notes (Signed)
  Subjective:    Patient ID: Kelsey [name], female    DOB: 29-Oct-1980, 31 y.o.   MRN: [id number]  HPI 2 weeks of right arm and hand numbness. Started in the deltoid part of the right arm and has progressively worked its way down so now her index long and ring finger are pretty much totally numb. Her thumb and PT finger are spared. She still has muscular function and is able to type and other activities but the numbness is pretty profound and aggravating. She is currently 33-6/[redacted] weeks pregnant. She has gained a lot of fluid weight particularly in the last one month and her OB/GYN has put her on reduced activity with reduced work hours. She last saw her OB/GYN this week and had a negative urine for protein. She's not had any blood pressure elevations or headaches.  Right-hand dominant.  Pertinent past medical history: No significant neck injury and no neck surgeries.   Review of Systems Has gained some weight with her pregnancy, 9 pounds in the last 2 weeks. She is having a lot of swelling in her extremities, no chest pains or visual changes.    Objective:   Physical Exam  Vital signs reviewed. GENERAL: Well developed, well nourished, no acute distress NEURO: Complete loss of sensation to soft touch in the C6-C7 dermatome. She also has decreased sensation to pinprick in the same dermatomal area. This includes the area at the top of her trapezius on the right. Reflexes his elbow 1+ bilaterally symmetrical. MSK: Strength particularly in muscles innervated by C4 through C8 and T1 I'll have 5 out of 5 strength compared with the other side. SKIN: In the affected area there is no rash, erythema, lesions. NECK: Full range of motion in flexion extension lateral rotation. They have Spurling's. No thyromegaly. No lymphadenopathy.     Assessment & Plan:  #1. Unusual sensory loss. We discussed options. It has certainly progressed in the last 2 weeks. I can't find any reason this will be looked related to  her pregnancy. Given the fact that she's pregnant I don't think we would gain anything by her wrist using plain x-ray or CT scan. I think we need to move forward with cervical MRI and we will set that up. I will let her want to get that back. If she has increasing symptoms particularly if she has weakness she will let me know immediately or go to the emergency room

## 2011-11-24 ENCOUNTER — Ambulatory Visit (HOSPITAL_COMMUNITY)
Admission: RE | Admit: 2011-11-24 | Discharge: 2011-11-24 | Disposition: A | Discharge status: Home / Self Care | Payer: BC Managed Care – PPO | Source: Ambulatory Visit | Attending: Family Medicine | Admitting: Family Medicine

## 2011-11-24 DIAGNOSIS — M542 Cervicalgia: Secondary | ICD-10-CM | POA: Insufficient documentation

## 2011-11-24 DIAGNOSIS — M5020 Other cervical disc displacement, unspecified cervical region: Secondary | ICD-10-CM | POA: Insufficient documentation

## 2011-11-24 DIAGNOSIS — R209 Unspecified disturbances of skin sensation: Secondary | ICD-10-CM | POA: Insufficient documentation

## 2011-11-24 DIAGNOSIS — R200 Anesthesia of skin: Secondary | ICD-10-CM

## 2011-12-17 ENCOUNTER — Encounter (HOSPITAL_COMMUNITY): Payer: Self-pay | Admitting: *Deleted

## 2011-12-17 ENCOUNTER — Encounter (HOSPITAL_COMMUNITY)
Admission: RE | Admit: 2011-12-17 | Discharge: 2011-12-17 | Disposition: A | Discharge status: Home / Self Care | Payer: BC Managed Care – PPO | Source: Ambulatory Visit | Attending: Obstetrics and Gynecology | Admitting: Obstetrics and Gynecology

## 2011-12-17 ENCOUNTER — Encounter (HOSPITAL_COMMUNITY): Payer: Self-pay

## 2011-12-17 ENCOUNTER — Inpatient Hospital Stay (HOSPITAL_COMMUNITY)
Admission: AD | Admit: 2011-12-17 | Discharge: 2011-12-17 | Disposition: A | Discharge status: Home / Self Care | Payer: BC Managed Care – PPO | Source: Ambulatory Visit | Attending: Obstetrics and Gynecology | Admitting: Obstetrics and Gynecology

## 2011-12-17 DIAGNOSIS — R51 Headache: Secondary | ICD-10-CM | POA: Insufficient documentation

## 2011-12-17 DIAGNOSIS — O99891 Other specified diseases and conditions complicating pregnancy: Secondary | ICD-10-CM | POA: Insufficient documentation

## 2011-12-17 HISTORY — DX: Headache: R51

## 2011-12-17 HISTORY — DX: Gestational (pregnancy-induced) hypertension without significant proteinuria, unspecified trimester: O13.9

## 2011-12-17 LAB — CBC
Hemoglobin: 12.4 g/dL (ref 12.0–15.0)
Platelets: 199 10*3/uL (ref 150–400)
RBC: 3.99 MIL/uL (ref 3.87–5.11)
WBC: 10.6 10*3/uL — ABNORMAL HIGH (ref 4.0–10.5)

## 2011-12-17 LAB — COMPREHENSIVE METABOLIC PANEL
ALT: 12 U/L (ref 0–35)
AST: 15 U/L (ref 0–37)
Alkaline Phosphatase: 129 U/L — ABNORMAL HIGH (ref 39–117)
CO2: 22 mEq/L (ref 19–32)
Calcium: 9 mg/dL (ref 8.4–10.5)
Chloride: 100 mEq/L (ref 96–112)
GFR calc Af Amer: >90 mL/min (ref >90)
GFR calc non Af Amer: >90 mL/min (ref >90)
Glucose, Bld: 92 mg/dL (ref 70–99)
Potassium: 3.8 mEq/L (ref 3.5–5.1)
Sodium: 135 mEq/L (ref 135–145)

## 2011-12-17 LAB — URINALYSIS, ROUTINE W REFLEX MICROSCOPIC
Bilirubin Urine: NEGATIVE
Glucose, UA: NEGATIVE mg/dL
Protein, ur: NEGATIVE mg/dL

## 2011-12-17 LAB — SURGICAL PCR SCREEN
MRSA, PCR: NEGATIVE
Staphylococcus aureus: POSITIVE — AB

## 2011-12-17 LAB — URINE MICROSCOPIC-ADD ON

## 2011-12-17 LAB — TYPE AND SCREEN: ABO/RH(D): O POS

## 2011-12-17 LAB — RPR: RPR Ser Ql: NONREACTIVE

## 2011-12-17 MED ORDER — ONDANSETRON 8 MG PO TBDP
8.0000 mg | ORAL_TABLET | Freq: Once | ORAL | Status: AC
  Administered 2011-12-17: 8 mg via ORAL
  Filled 2011-12-17: qty 1

## 2011-12-17 MED ORDER — ONDANSETRON HCL 8 MG PO TABS: 8.0000 mg | ORAL_TABLET | Freq: Three times a day (TID) | ORAL | Status: DC | PRN

## 2011-12-17 MED ORDER — ACETAMINOPHEN 325 MG PO TABS
650.0000 mg | ORAL_TABLET | Freq: Once | ORAL | Status: AC
  Administered 2011-12-17: 650 mg via ORAL
  Filled 2011-12-17: qty 2

## 2011-12-17 NOTE — Patient Instructions (Addendum)
18 Kelsey [name]  12/17/2011   Your procedure is scheduled on:  12/19/11  Enter through the Main Entrance of [facility] at 6 AM.  Pick up the phone at the desk and dial [phone].   Call this number if you have problems the morning of surgery: [phone]   Remember:   Do not eat food:After Midnight.  Do not drink clear liquids: After Midnight.  Take these medicines the morning of surgery with A SIP OF WATER: NA   Do not wear jewelry, make-up or nail polish.  Do not wear lotions, powders, or perfumes. You may wear deodorant.  Do not shave 48 hours prior to surgery.  Do not bring valuables to the hospital.  Contacts, dentures or bridgework may not be worn into surgery.  Leave suitcase in the car. After surgery it may be brought to your room.  For patients admitted to the hospital, checkout time is 11:00 AM the day of discharge.   Patients discharged the day of surgery will not be allowed to drive home.  Name and phone number of your driver: NA  Special Instructions: Shower using CHG 2 nights before surgery and the night before surgery.  If you shower the day of surgery use CHG.  Use special wash - you have one bottle of CHG for all showers.  You should use approximately 1/3 of the bottle for each shower.   Please read over the following fact sheets that you were given: MRSA Information

## 2011-12-17 NOTE — MAU Note (Signed)
Pt sent from office for evaluation of elevated BP and mild pre-e.  Reports that HA and nausea have resolved after zofran and phenergan.  + FM  AF, VSS  BP 130s/70-80s FHT reassuring Gen - NAD Abd - gravid, NT Ext 2+ edema bilaterally, no clonus  Labs reviewed - normal LFTs and plts  A/P:  Pt feeling better, ok for home on bedrest Will schedule rpt c-section for 37wks

## 2011-12-17 NOTE — MAU Note (Signed)
Pt sent from MD office for PIH eval, has had HA since last night, also nauseated.  Denies LOF or bleeding.

## 2011-12-18 NOTE — H&P (Signed)
Kelsey [name], Kelsey [name] NO.:  [id number]  MEDICAL RECORD NO.:  [id number]  LOCATION:  PERIO                         FACILITY:  [facility]  PHYSICIAN:  Zelphia Cairo, MD    DATE OF BIRTH:  April 05, 1980  DATE OF ADMISSION:  10/07/2011 DATE OF DISCHARGE:  12/17/2011                             HISTORY & PHYSICAL   HISTORY OF PRESENT ILLNESS:  A 31 year old, G2, P1 at 37 weeks, presents today for repeat C-section.  Pregnancy has been complicated by mild preeclampsia.  Over the past 2 weeks, she has been managed as an outpatient for elevated blood pressures and mild proteinuria.  Most recent blood pressure was 152/92 with trace proteinuria.  No evidence of HELLP syndrome.  Mildly elevated uric acid was noted on her last blood work.  PAST MEDICAL HISTORY:  Negative.  SURGICAL HISTORY:  C-section and D and E.  SOCIAL HISTORY:  Negative for tobacco, alcohol, and drug use.  FAMILY HISTORY:  Lung cancer, diabetes, heart disease, and chronic hypertension.  PHYSICAL EXAMINATION:  VITAL SIGNS:  She is afebrile.  Blood pressure is 130s to 150s over 80s to 90s. GENERAL:  She is in no acute distress. HEART:  Regular rate and rhythm. LUNGS:  Clear bilaterally. ABDOMEN:  Gravid and nontender.  Pelvic exam shows closed cervix.  ASSESSMENT: 1. Mild preeclampsia. 2. Desires repeat cesarean section. 3. Desires permanent sterilization.  PLAN:  Repeat C-section with tubal ligation.     Zelphia Cairo, MD     GA/MEDQ  D:  12/17/2011  T:  12/18/2011  Job:  [id number]

## 2011-12-19 ENCOUNTER — Encounter (HOSPITAL_COMMUNITY): Payer: Self-pay | Admitting: Anesthesiology

## 2011-12-19 ENCOUNTER — Encounter (HOSPITAL_COMMUNITY)
Admission: RE | Disposition: A | Discharge status: Home / Self Care | Payer: Self-pay | Source: Ambulatory Visit | Attending: Obstetrics and Gynecology

## 2011-12-19 ENCOUNTER — Inpatient Hospital Stay (HOSPITAL_COMMUNITY): Payer: BC Managed Care – PPO | Admitting: Anesthesiology

## 2011-12-19 ENCOUNTER — Inpatient Hospital Stay (HOSPITAL_COMMUNITY)
Admission: RE | Admit: 2011-12-19 | Discharge: 2011-12-21 | DRG: 370 | Disposition: A | Discharge status: Home / Self Care | Payer: BC Managed Care – PPO | Source: Ambulatory Visit | Attending: Obstetrics and Gynecology | Admitting: Obstetrics and Gynecology

## 2011-12-19 ENCOUNTER — Encounter (HOSPITAL_COMMUNITY): Payer: Self-pay | Admitting: General Surgery

## 2011-12-19 DIAGNOSIS — Z01812 Encounter for preprocedural laboratory examination: Secondary | ICD-10-CM

## 2011-12-19 DIAGNOSIS — Z01818 Encounter for other preprocedural examination: Secondary | ICD-10-CM

## 2011-12-19 DIAGNOSIS — Z302 Encounter for sterilization: Secondary | ICD-10-CM

## 2011-12-19 DIAGNOSIS — O321XX0 Maternal care for breech presentation, not applicable or unspecified: Secondary | ICD-10-CM | POA: Diagnosis present

## 2011-12-19 DIAGNOSIS — O34219 Maternal care for unspecified type scar from previous cesarean delivery: Principal | ICD-10-CM | POA: Diagnosis present

## 2011-12-19 DIAGNOSIS — IMO0002 Reserved for concepts with insufficient information to code with codable children: Secondary | ICD-10-CM | POA: Diagnosis present

## 2011-12-19 LAB — PREPARE RBC (CROSSMATCH)

## 2011-12-19 SURGERY — Surgical Case
Anesthesia: Spinal | Site: Abdomen | Laterality: Bilateral | Wound class: Clean Contaminated

## 2011-12-19 MED ORDER — MORPHINE SULFATE 0.5 MG/ML IJ SOLN
INTRAMUSCULAR | Status: AC
  Filled 2011-12-19: qty 10

## 2011-12-19 MED ORDER — ONDANSETRON HCL 4 MG/2ML IJ SOLN
INTRAMUSCULAR | Status: AC
  Filled 2011-12-19: qty 2

## 2011-12-19 MED ORDER — DIPHENHYDRAMINE HCL 50 MG/ML IJ SOLN: 25.0000 mg | INTRAMUSCULAR | Status: DC | PRN

## 2011-12-19 MED ORDER — OXYTOCIN 10 UNIT/ML IJ SOLN
INTRAMUSCULAR | Status: AC
  Filled 2011-12-19: qty 1

## 2011-12-19 MED ORDER — KETOROLAC TROMETHAMINE 30 MG/ML IJ SOLN
30.0000 mg | Freq: Four times a day (QID) | INTRAMUSCULAR | Status: AC | PRN
  Administered 2011-12-19: 30 mg via INTRAVENOUS
  Filled 2011-12-19: qty 1

## 2011-12-19 MED ORDER — SODIUM CHLORIDE 0.9 % IJ SOLN: 3.0000 mL | INTRAMUSCULAR | Status: DC | PRN

## 2011-12-19 MED ORDER — TETANUS-DIPHTH-ACELL PERTUSSIS 5-2.5-18.5 LF-MCG/0.5 IM SUSP: 0.5000 mL | Freq: Once | INTRAMUSCULAR | Status: DC

## 2011-12-19 MED ORDER — MEDROXYPROGESTERONE ACETATE 150 MG/ML IM SUSP: 150.0000 mg | INTRAMUSCULAR | Status: DC | PRN

## 2011-12-19 MED ORDER — ONDANSETRON HCL 4 MG PO TABS: 4.0000 mg | ORAL_TABLET | ORAL | Status: DC | PRN

## 2011-12-19 MED ORDER — DIPHENHYDRAMINE HCL 25 MG PO CAPS: 25.0000 mg | ORAL_CAPSULE | ORAL | Status: DC | PRN

## 2011-12-19 MED ORDER — SENNOSIDES-DOCUSATE SODIUM 8.6-50 MG PO TABS
2.0000 | ORAL_TABLET | Freq: Every day | ORAL | Status: DC
  Administered 2011-12-19 – 2011-12-20 (×2): 2 via ORAL

## 2011-12-19 MED ORDER — NALBUPHINE HCL 10 MG/ML IJ SOLN
5.0000 mg | INTRAMUSCULAR | Status: DC | PRN
  Filled 2011-12-19: qty 1

## 2011-12-19 MED ORDER — SCOPOLAMINE 1 MG/3DAYS TD PT72
1.0000 | MEDICATED_PATCH | Freq: Once | TRANSDERMAL | Status: DC
  Filled 2011-12-19: qty 1

## 2011-12-19 MED ORDER — MORPHINE SULFATE (PF) 0.5 MG/ML IJ SOLN
INTRAMUSCULAR | Status: DC | PRN
  Administered 2011-12-19: .1 mg via INTRATHECAL

## 2011-12-19 MED ORDER — NALBUPHINE HCL 10 MG/ML IJ SOLN
5.0000 mg | INTRAMUSCULAR | Status: DC | PRN
  Administered 2011-12-19: 5 mg via INTRAVENOUS
  Filled 2011-12-19 (×2): qty 1

## 2011-12-19 MED ORDER — SCOPOLAMINE 1 MG/3DAYS TD PT72
1.0000 | MEDICATED_PATCH | Freq: Once | TRANSDERMAL | Status: DC
  Administered 2011-12-19: 1.5 mg via TRANSDERMAL

## 2011-12-19 MED ORDER — FENTANYL CITRATE 0.05 MG/ML IJ SOLN
INTRAMUSCULAR | Status: DC | PRN
  Administered 2011-12-19: 25 ug via INTRATHECAL

## 2011-12-19 MED ORDER — SIMETHICONE 80 MG PO CHEW: 80.0000 mg | CHEWABLE_TABLET | ORAL | Status: DC | PRN

## 2011-12-19 MED ORDER — ONDANSETRON HCL 4 MG/2ML IJ SOLN
INTRAMUSCULAR | Status: DC | PRN
  Administered 2011-12-19: 4 mg via INTRAVENOUS

## 2011-12-19 MED ORDER — NALOXONE HCL 0.4 MG/ML IJ SOLN: 0.4000 mg | INTRAMUSCULAR | Status: DC | PRN

## 2011-12-19 MED ORDER — MEPERIDINE HCL 25 MG/ML IJ SOLN
INTRAMUSCULAR | Status: DC | PRN
  Administered 2011-12-19: 12.5 mg via INTRAVENOUS

## 2011-12-19 MED ORDER — ONDANSETRON HCL 4 MG/2ML IJ SOLN: 4.0000 mg | INTRAMUSCULAR | Status: DC | PRN

## 2011-12-19 MED ORDER — HYDROMORPHONE HCL PF 1 MG/ML IJ SOLN: 0.2500 mg | INTRAMUSCULAR | Status: DC | PRN

## 2011-12-19 MED ORDER — LANOLIN HYDROUS EX OINT: 1.0000 "application " | TOPICAL_OINTMENT | CUTANEOUS | Status: DC | PRN

## 2011-12-19 MED ORDER — DIPHENHYDRAMINE HCL 25 MG PO CAPS: 25.0000 mg | ORAL_CAPSULE | Freq: Four times a day (QID) | ORAL | Status: DC | PRN

## 2011-12-19 MED ORDER — IBUPROFEN 600 MG PO TABS
600.0000 mg | ORAL_TABLET | Freq: Four times a day (QID) | ORAL | Status: DC | PRN
  Filled 2011-12-19 (×4): qty 1

## 2011-12-19 MED ORDER — LACTATED RINGERS IV SOLN
INTRAVENOUS | Status: DC | PRN
  Administered 2011-12-19 (×2): via INTRAVENOUS

## 2011-12-19 MED ORDER — BUPIVACAINE IN DEXTROSE 0.75-8.25 % IT SOLN
INTRATHECAL | Status: DC | PRN
  Administered 2011-12-19: 11 mg via INTRATHECAL

## 2011-12-19 MED ORDER — KETOROLAC TROMETHAMINE 60 MG/2ML IM SOLN
INTRAMUSCULAR | Status: AC
  Administered 2011-12-19: 60 mg via INTRAMUSCULAR
  Filled 2011-12-19: qty 2

## 2011-12-19 MED ORDER — MEPERIDINE HCL 25 MG/ML IJ SOLN
INTRAMUSCULAR | Status: AC
  Filled 2011-12-19: qty 1

## 2011-12-19 MED ORDER — DEXTROSE IN LACTATED RINGERS 5 % IV SOLN
INTRAVENOUS | Status: DC
  Administered 2011-12-19: 18:00:00 via INTRAVENOUS

## 2011-12-19 MED ORDER — LACTATED RINGERS IV SOLN
INTRAVENOUS | Status: DC
  Administered 2011-12-19: 125 mL/h via INTRAVENOUS

## 2011-12-19 MED ORDER — EPHEDRINE SULFATE 50 MG/ML IJ SOLN
INTRAMUSCULAR | Status: DC | PRN
  Administered 2011-12-19: 10 mg via INTRAVENOUS

## 2011-12-19 MED ORDER — SODIUM CHLORIDE 0.9 % IV SOLN
1.0000 ug/kg/h | INTRAVENOUS | Status: DC | PRN
  Filled 2011-12-19: qty 2.5

## 2011-12-19 MED ORDER — SIMETHICONE 80 MG PO CHEW
80.0000 mg | CHEWABLE_TABLET | Freq: Three times a day (TID) | ORAL | Status: DC
  Administered 2011-12-19 – 2011-12-21 (×6): 80 mg via ORAL

## 2011-12-19 MED ORDER — KETOROLAC TROMETHAMINE 60 MG/2ML IM SOLN
60.0000 mg | Freq: Once | INTRAMUSCULAR | Status: AC | PRN
  Administered 2011-12-19: 60 mg via INTRAMUSCULAR

## 2011-12-19 MED ORDER — KETOROLAC TROMETHAMINE 30 MG/ML IJ SOLN: 30.0000 mg | Freq: Four times a day (QID) | INTRAMUSCULAR | Status: AC | PRN

## 2011-12-19 MED ORDER — MEASLES, MUMPS & RUBELLA VAC ~~LOC~~ INJ
0.5000 mL | INJECTION | Freq: Once | SUBCUTANEOUS | Status: DC
  Filled 2011-12-19: qty 0.5

## 2011-12-19 MED ORDER — MENTHOL 3 MG MT LOZG: 1.0000 | LOZENGE | OROMUCOSAL | Status: DC | PRN

## 2011-12-19 MED ORDER — PRENATAL MULTIVITAMIN CH
1.0000 | ORAL_TABLET | Freq: Every day | ORAL | Status: DC
  Administered 2011-12-20 – 2011-12-21 (×2): 1 via ORAL
  Filled 2011-12-19 (×2): qty 1

## 2011-12-19 MED ORDER — ONDANSETRON HCL 4 MG/2ML IJ SOLN: 4.0000 mg | Freq: Three times a day (TID) | INTRAMUSCULAR | Status: DC | PRN

## 2011-12-19 MED ORDER — OXYTOCIN 40 UNITS IN LACTATED RINGERS INFUSION - SIMPLE MED: 62.5000 mL/h | INTRAVENOUS | Status: AC

## 2011-12-19 MED ORDER — FENTANYL CITRATE 0.05 MG/ML IJ SOLN
INTRAMUSCULAR | Status: AC
  Filled 2011-12-19: qty 2

## 2011-12-19 MED ORDER — MEPERIDINE HCL 25 MG/ML IJ SOLN: 6.2500 mg | INTRAMUSCULAR | Status: DC | PRN

## 2011-12-19 MED ORDER — PHENYLEPHRINE HCL 10 MG/ML IJ SOLN
INTRAMUSCULAR | Status: DC | PRN
  Administered 2011-12-19: 80 ug via INTRAVENOUS

## 2011-12-19 MED ORDER — METOCLOPRAMIDE HCL 5 MG/ML IJ SOLN: 10.0000 mg | Freq: Three times a day (TID) | INTRAMUSCULAR | Status: DC | PRN

## 2011-12-19 MED ORDER — PHENYLEPHRINE 40 MCG/ML (10ML) SYRINGE FOR IV PUSH (FOR BLOOD PRESSURE SUPPORT)
PREFILLED_SYRINGE | INTRAVENOUS | Status: AC
  Filled 2011-12-19: qty 5

## 2011-12-19 MED ORDER — DIPHENHYDRAMINE HCL 50 MG/ML IJ SOLN: 12.5000 mg | INTRAMUSCULAR | Status: DC | PRN

## 2011-12-19 MED ORDER — OXYCODONE-ACETAMINOPHEN 5-325 MG PO TABS
1.0000 | ORAL_TABLET | ORAL | Status: DC | PRN
  Administered 2011-12-20 – 2011-12-21 (×5): 1 via ORAL
  Administered 2011-12-21: 2 via ORAL
  Filled 2011-12-19: qty 2
  Filled 2011-12-19 (×5): qty 1

## 2011-12-19 MED ORDER — OXYTOCIN 10 UNIT/ML IJ SOLN
40.0000 [IU] | INTRAVENOUS | Status: DC | PRN
  Administered 2011-12-19: 40 [IU] via INTRAVENOUS

## 2011-12-19 MED ORDER — IBUPROFEN 600 MG PO TABS
600.0000 mg | ORAL_TABLET | Freq: Four times a day (QID) | ORAL | Status: DC
  Administered 2011-12-19 – 2011-12-21 (×6): 600 mg via ORAL
  Filled 2011-12-19: qty 1

## 2011-12-19 MED ORDER — CEFAZOLIN SODIUM-DEXTROSE 2-3 GM-% IV SOLR
2.0000 g | INTRAVENOUS | Status: AC
  Administered 2011-12-19: 2 g via INTRAVENOUS

## 2011-12-19 MED ORDER — DIBUCAINE 1 % RE OINT: 1.0000 "application " | TOPICAL_OINTMENT | RECTAL | Status: DC | PRN

## 2011-12-19 MED ORDER — LACTATED RINGERS IV SOLN
INTRAVENOUS | Status: DC | PRN
  Administered 2011-12-19: 08:00:00 via INTRAVENOUS

## 2011-12-19 MED ORDER — WITCH HAZEL-GLYCERIN EX PADS: 1.0000 "application " | MEDICATED_PAD | CUTANEOUS | Status: DC | PRN

## 2011-12-19 MED ORDER — EPHEDRINE 5 MG/ML INJ
INTRAVENOUS | Status: AC
  Filled 2011-12-19: qty 10

## 2011-12-19 SURGICAL SUPPLY — 37 items
ADH SKN CLS APL DERMABOND .7 (GAUZE/BANDAGES/DRESSINGS) ×1
CLOTH BEACON ORANGE TIMEOUT ST (SAFETY) ×2 IMPLANT
CONT SPEC 4OZ CLIKSEAL STRL BL (MISCELLANEOUS) ×2 IMPLANT
DERMABOND ADVANCED (GAUZE/BANDAGES/DRESSINGS) ×1
DERMABOND ADVANCED .7 DNX12 (GAUZE/BANDAGES/DRESSINGS) ×1 IMPLANT
DRAPE SURG 17X23 STRL (DRAPES) ×2 IMPLANT
DRSG COVADERM 4X10 (GAUZE/BANDAGES/DRESSINGS) ×1 IMPLANT
DURAPREP 26ML APPLICATOR (WOUND CARE) ×2 IMPLANT
ELECT REM PT RETURN 9FT ADLT (ELECTROSURGICAL) ×2
ELECTRODE REM PT RTRN 9FT ADLT (ELECTROSURGICAL) ×1 IMPLANT
EXTRACTOR VACUUM M CUP 4 TUBE (SUCTIONS) IMPLANT
GAUZE SPONGE 4X4 12PLY STRL LF (GAUZE/BANDAGES/DRESSINGS) ×1 IMPLANT
GLOVE BIO SURGEON STRL SZ 6.5 (GLOVE) ×3 IMPLANT
GLOVE BIOGEL PI IND STRL 7.0 (GLOVE) ×2 IMPLANT
GLOVE BIOGEL PI INDICATOR 7.0 (GLOVE) ×2
GLOVE SURG SS PI 7.0 STRL IVOR (GLOVE) ×1 IMPLANT
GOWN PREVENTION PLUS LG XLONG (DISPOSABLE) ×6 IMPLANT
KIT ABG SYR 3ML LUER SLIP (SYRINGE) ×2 IMPLANT
NDL HYPO 25X5/8 SAFETYGLIDE (NEEDLE) ×1 IMPLANT
NEEDLE HYPO 25X5/8 SAFETYGLIDE (NEEDLE) ×2 IMPLANT
NS IRRIG 1000ML POUR BTL (IV SOLUTION) ×2 IMPLANT
PACK C SECTION WH (CUSTOM PROCEDURE TRAY) ×2 IMPLANT
PAD ABD 7.5X8 STRL (GAUZE/BANDAGES/DRESSINGS) ×1 IMPLANT
PAD OB MATERNITY 4.3X12.25 (PERSONAL CARE ITEMS) ×1 IMPLANT
SLEEVE SCD COMPRESS KNEE MED (MISCELLANEOUS) IMPLANT
SPONGE GAUZE 4X4 12PLY (GAUZE/BANDAGES/DRESSINGS) ×1 IMPLANT
STAPLER VISISTAT 35W (STAPLE) ×1 IMPLANT
SUT CHROMIC 0 CT 802H (SUTURE) IMPLANT
SUT CHROMIC 0 CTX 36 (SUTURE) ×6 IMPLANT
SUT MON AB-0 CT1 36 (SUTURE) ×2 IMPLANT
SUT PDS AB 0 CTX 60 (SUTURE) ×2 IMPLANT
SUT PLAIN 0 NONE (SUTURE) ×3 IMPLANT
SUT VIC AB 4-0 KS 27 (SUTURE) IMPLANT
TAPE CLOTH SURG 4X10 WHT LF (GAUZE/BANDAGES/DRESSINGS) ×1 IMPLANT
TOWEL OR 17X24 6PK STRL BLUE (TOWEL DISPOSABLE) ×4 IMPLANT
TRAY FOLEY CATH 14FR (SET/KITS/TRAYS/PACK) ×1 IMPLANT
WATER STERILE IRR 1000ML POUR (IV SOLUTION) ×2 IMPLANT

## 2011-12-19 NOTE — OR Nursing (Signed)
Placenta to OR refrigerator. 

## 2011-12-19 NOTE — Anesthesia Postprocedure Evaluation (Signed)
Anesthesia Post Note  Patient: Kelsey [name]  Procedure(s) Performed: Procedure(s) (LRB): CESAREAN SECTION WITH BILATERAL TUBAL LIGATION (Bilateral)  Anesthesia type: Spinal  Patient location: PACU  Post pain: Pain level controlled  Post assessment: Post-op Vital signs reviewed  Last Vitals:  Filed Vitals:   12/19/11 0830  BP: 135/48  Pulse: 94  Temp: 36.5 C  Resp: 17    Post vital signs: Reviewed  Level of consciousness: awake  Complications: No apparent anesthesia complications

## 2011-12-19 NOTE — Anesthesia Procedure Notes (Signed)
Spinal  Patient location during procedure: OR Start time: 12/19/2011 7:26 AM Staffing Anesthesiologist: Brayton Caves R Performed by: anesthesiologist  Preanesthetic Checklist Completed: patient identified, site marked, surgical consent, pre-op evaluation, timeout performed, IV checked, risks and benefits discussed and monitors and equipment checked Spinal Block Patient position: sitting Prep: DuraPrep Patient monitoring: heart rate, cardiac monitor, continuous pulse ox and blood pressure Approach: midline Location: L3-4 Injection technique: single-shot Needle Needle type: Sprotte  Needle gauge: 24 G Needle length: 9 cm Assessment Sensory level: T4 Additional Notes Patient identified.  Risk benefits discussed including failed block, incomplete pain control, headache, nerve damage, paralysis, blood pressure changes, nausea, vomiting, reactions to medication both toxic or allergic, and postpartum back pain.  Patient expressed understanding and wished to proceed.  All questions were answered.  Sterile technique used throughout procedure.  CSF was clear.  No parasthesia or other complications.  Please see nursing notes for vital signs.

## 2011-12-19 NOTE — Progress Notes (Signed)
Plan of care reviewed with pt.  R/b/a discussed.  Informed consent

## 2011-12-19 NOTE — Addendum Note (Signed)
Addendum  created 12/19/11 1557 by Jhonnie Garner, CRNA   Modules edited:Notes Section

## 2011-12-19 NOTE — Anesthesia Preprocedure Evaluation (Signed)
Anesthesia Evaluation  Patient identified by MRN, date of birth, ID band Patient awake    Reviewed: Allergy & Precautions, H&P , Patient's Chart, lab work & pertinent test results  Airway Mallampati: II TM Distance: >3 FB Neck ROM: full    Dental No notable dental hx.    Pulmonary  breath sounds clear to auscultation  Pulmonary exam normal       Cardiovascular Exercise Tolerance: Good hypertension (PIH mild), Rhythm:regular Rate:Normal     Neuro/Psych    GI/Hepatic   Endo/Other    Renal/GU      Musculoskeletal   Abdominal   Peds  Hematology   Anesthesia Other Findings   Reproductive/Obstetrics                           Anesthesia Physical Anesthesia Plan  ASA: III  Anesthesia Plan: Spinal   Post-op Pain Management:    Induction:   Airway Management Planned:   Additional Equipment:   Intra-op Plan:   Post-operative Plan:   Informed Consent: I have reviewed the patients History and Physical, chart, labs and discussed the procedure including the risks, benefits and alternatives for the proposed anesthesia with the patient or authorized representative who has indicated his/her understanding and acceptance.   Dental Advisory Given  Plan Discussed with: CRNA  Anesthesia Plan Comments: (Lab work confirmed with CRNA in room. Platelets okay. Discussed spinal anesthetic, and patient consents to the procedure:  included risk of possible headache,backache, failed block, allergic reaction, and nerve injury. This patient was asked if she had any questions or concerns before the procedure started. )        Anesthesia Quick Evaluation

## 2011-12-19 NOTE — Transfer of Care (Signed)
Immediate Anesthesia Transfer of Care Note  Patient: Kelsey [name]  Procedure(s) Performed: Procedure(s) (LRB) with comments: CESAREAN SECTION WITH BILATERAL TUBAL LIGATION (Bilateral)  Patient Location: PACU  Anesthesia Type: Spinal  Level of Consciousness: awake, alert  and oriented  Airway & Oxygen Therapy: Patient Spontanous Breathing  Post-op Assessment: Report given to PACU RN and Post -op Vital signs reviewed and stable  Post vital signs: Reviewed and stable  Complications: No apparent anesthesia complications

## 2011-12-19 NOTE — Anesthesia Postprocedure Evaluation (Signed)
Anesthesia Post Note  Patient: Kelsey [name]  Procedure(s) Performed: Procedure(s) (LRB): CESAREAN SECTION WITH BILATERAL TUBAL LIGATION (Bilateral)  Anesthesia type: SAB  Patient location: Mother/Baby  Post pain: Pain level controlled  Post assessment: Post-op Vital signs reviewed  Last Vitals:  Filed Vitals:   12/19/11 1500  BP: 140/62  Pulse: 94  Temp: 37.6 C  Resp: 20    Post vital signs: Reviewed  Level of consciousness: awake  Complications: No apparent anesthesia complications

## 2011-12-19 NOTE — Op Note (Signed)
[name], [name] NO.:  [id number]  MEDICAL RECORD NO.:  [id number]  LOCATION:  9113                          FACILITY:  [facility]  PHYSICIAN:  Zelphia Cairo, MD    DATE OF BIRTH:  January 01, 1981  DATE OF PROCEDURE:  12/19/2011 DATE OF DISCHARGE:                              OPERATIVE REPORT   PREOPERATIVE DIAGNOSES: 1. Intrauterine pregnancy at 37 weeks. 2. Mild preeclampsia. 3. Desires permanent sterilization.  POSTOPERATIVE DIAGNOSES: 1. Intrauterine pregnancy at 37 weeks. 2. Mild preeclampsia. 3. Desires permanent sterilization.  PROCEDURE: 1. Repeat cesarean section. 2. Bilateral partial salpingectomy.  SURGEON:  Zelphia Cairo, MD  ASSISTANT:  None.  ANESTHESIA:  Spinal.  COMPLICATIONS:  None.  ESTIMATED BLOOD LOSS:  500 mL.  URINE OUTPUT:  Clear.  CONDITION:  Stable to recovery room.  PROCEDURE:  After informed consent was obtained, the patient was taken to the operating room, where spinal anesthesia was found to be adequate. She was prepped and draped in sterile fashion and a Foley catheter was inserted sterilely.  Pfannenstiel skin incision was made with a scalpel and extended to the underlying fascia.  The fascia was incised in the midline and extended laterally using curved Mayo scissors.  Kocher clamps were used to grasp the superior and inferior portion of the fascia.  The fascia was tented upwards and the underlying rectus muscles were dissected off using the Bovie.  Peritoneum was identified and entered sharply.  This was extended superiorly and inferiorly with good visualization of the bladder.  The bladder blade was then inserted. Vesicouterine peritoneum was dissected off the lower uterine segment. The bladder blade was repositioned.  Uterine incision was made with the scalpel and extended bluntly using my fingers.  Membranes were ruptured for clear fluid.  Fetus was noted to be in the breech presentation and was delivered  using standard breech maneuvers.  The infant's mouth and nose were suctioned as the cord was clamped and cut and the infant was taken to the awaiting pediatric staff.  The placenta was then manually removed.  The uterus was cleared of all clots and debris using a dry lap sponge.  The uterine incision was closed using a running locked stitch of 0 chromic.  Hemostasis was achieved and our attention was turned to the fallopian tubes.  The right fallopian tube was grasped with a Babcock clamp and the underlying mesosalpinx was visualized.  A small window was made in the mesosalpinx and chromic was placed through this window and a small knuckle of tube was doubly ligated using plain gut suture.  The knuckle was then transected using Metzenbaum scissors and passed off to be sent to Pathology.  Hemostasis was achieved and this procedure was repeated on the left fallopian tube.  Hemostasis was assured bilaterally and our attention was refocused to the uterine incision.  The uterine incision was reinspected and found to be hemostatic.  The pelvis was then irrigated with saline and the peritoneum was closed with 0 Monocryl. The fascia was closed with a looped 0 PDS and the skin was closed with staples.  Sponge, lap, needle, and instrument counts were correct x2. She was taken to  the recovery room in stable condition.  Sponge, lap, needle, and instrument counts were correct.     Zelphia Cairo, MD     GA/MEDQ  D:  12/19/2011  T:  12/19/2011  Job:  [id number]

## 2011-12-20 LAB — CBC
MCH: 31.8 pg (ref 26.0–34.0)
MCHC: 33.5 g/dL (ref 30.0–36.0)
Platelets: 158 10*3/uL (ref 150–400)
RBC: 3.43 MIL/uL — ABNORMAL LOW (ref 3.87–5.11)

## 2011-12-20 NOTE — Progress Notes (Signed)
Subjective: Postpartum Day 1: Cesarean Delivery Patient reports tolerating PO, + flatus and no problems voiding.  No HA, CP/SOB, RUQ pain, or visual disturbance.  Wants baby boy to be circumcised.  Objective: Vital signs in last 24 hours: Temp:  [97.6 F (36.4 C)-99.6 F (37.6 C)] 98.6 F (37 C) (10/12 0800) Pulse Rate:  [79-99] 84  (10/12 0800) Resp:  [18-20] 18  (10/12 0800) BP: (110-148)/(60-90) 120/60 mmHg (10/12 0800) SpO2:  [95 %-100 %] 98 % (10/12 0800) Weight:  [99.791 kg (220 lb)] 99.791 kg (220 lb) (10/11 1610)  Physical Exam:  General: alert, cooperative and appears stated age 31: appropriate Uterine Fundus: firm Incision: healing well, no significant drainage, no dehiscence, no significant erythema DVT Evaluation: No evidence of DVT seen on physical exam. Negative Homan's sign.   Basename 12/20/11 0550 12/17/11 1225  HGB 10.9* 12.4  HCT 32.5* 37.4    Assessment/Plan: Status post Cesarean section. Doing well postoperatively.  Continue current care. Will do circumcision today.   Pre-E-Stable.  No symptoms.  BPs wnl.    Delron Comer 12/20/2011, 9:18 AM

## 2011-12-21 MED ORDER — IBUPROFEN 600 MG PO TABS: 600.0000 mg | ORAL_TABLET | Freq: Four times a day (QID) | ORAL | Status: DC | PRN

## 2011-12-21 MED ORDER — OXYCODONE-ACETAMINOPHEN 5-325 MG PO TABS: 1.0000 | ORAL_TABLET | ORAL | Status: DC | PRN

## 2011-12-21 NOTE — Discharge Summary (Signed)
Obstetric Discharge Summary Reason for Admission: cesarean section and Pre-Eclampsia; h/o previous C/S Prenatal Procedures: Preeclampsia Intrapartum Procedures: cesarean: low cervical, transverse Postpartum Procedures: none Complications-Operative and Postpartum: none Hemoglobin  Date Value Range Status  12/20/2011 10.9* 12.0 - 15.0 g/dL Final     HCT  Date Value Range Status  12/20/2011 32.5* 36.0 - 46.0 % Final    Physical Exam:  General: alert, cooperative and appears stated age Lochia: appropriate Uterine Fundus: firm Incision: healing well, no significant drainage, no dehiscence, no significant erythema.  Staples in place. DVT Evaluation: No evidence of DVT seen on physical exam. Negative Homan's sign. No cords or calf tenderness. No significant calf/ankle edema.  Discharge Diagnoses: Term Pregnancy-delivered and Preelampsia  Discharge Information: Date: 12/21/2011 Activity: pelvic rest Diet: routine Medications: PNV, Ibuprofen, Colace and Percocet Condition: stable Instructions: refer to practice specific booklet Discharge to: home   Newborn Data: Live born female  Birth Weight: 6 lb 5.8 oz (2885 g) APGAR: 8, 9  Home with mother.  Nolon Yellin 12/21/2011, 9:36 AM

## 2011-12-21 NOTE — Progress Notes (Signed)
Subjective: Postpartum Day 2: Cesarean Delivery Patient reports tolerating PO, + flatus and no problems voiding.  Requesting to go home early today.  No HA, CP/SOB, RUQ pain, or vision change.  Objective: Vital signs in last 24 hours: Temp:  [97.3 F (36.3 C)-98.6 F (37 C)] 97.3 F (36.3 C) (10/13 0546) Pulse Rate:  [81-88] 81  (10/13 0546) Resp:  [20] 20  (10/13 0546) BP: (112-124)/(72-76) 114/72 mmHg (10/13 0546) SpO2:  [98 %] 98 % (10/12 2210)  Physical Exam:  General: alert, cooperative and appears stated age Lochia: appropriate Uterine Fundus: firm Incision: healing well, no significant drainage, no dehiscence, no significant erythema.  Staples in place. DVT Evaluation: No evidence of DVT seen on physical exam. Negative Homan's sign. No cords or calf tenderness.   Basename 12/20/11 0550  HGB 10.9*  HCT 32.5*    Assessment/Plan: Status post Cesarean section. Doing well postoperatively.  Discharge home with standard precautions and return to clinic in 4-6 weeks.  Staple removal in 2 days. PreE: nl BPs and no symptoms.  S/P Mag recovery.  Kelsey [name] 12/21/2011, 9:31 AM

## 2011-12-23 LAB — TYPE AND SCREEN
ABO/RH(D): O POS
Antibody Screen: NEGATIVE
Unit division: 0

## 2011-12-25 ENCOUNTER — Ambulatory Visit (HOSPITAL_COMMUNITY): Payer: BC Managed Care – PPO

## 2011-12-26 ENCOUNTER — Other Ambulatory Visit (HOSPITAL_COMMUNITY): Payer: BC Managed Care – PPO

## 2013-11-18 ENCOUNTER — Ambulatory Visit (INDEPENDENT_AMBULATORY_CARE_PROVIDER_SITE_OTHER): Payer: BC Managed Care – PPO | Admitting: Family Medicine

## 2013-11-18 ENCOUNTER — Encounter: Payer: Self-pay | Admitting: Family Medicine

## 2013-11-18 VITALS — BP 111/77 | HR 81 | Temp 98.4°F | Ht 61.0 in | Wt 159.8 lb

## 2013-11-18 DIAGNOSIS — R1011 Right upper quadrant pain: Secondary | ICD-10-CM

## 2013-11-18 LAB — CBC
HEMATOCRIT: 40 % (ref 36.0–46.0)
HEMOGLOBIN: 13.7 g/dL (ref 12.0–15.0)
MCH: 30.6 pg (ref 26.0–34.0)
MCHC: 34.3 g/dL (ref 30.0–36.0)
MCV: 89.5 fL (ref 78.0–100.0)
Platelets: 271 10*3/uL (ref 150–400)
RBC: 4.47 MIL/uL (ref 3.87–5.11)
RDW: 13.9 % (ref 11.5–15.5)
WBC: 5.2 10*3/uL (ref 4.0–10.5)

## 2013-11-18 NOTE — Progress Notes (Signed)
   Subjective:    Patient ID: Kelsey [name], female    DOB: May 23, 1980, 34 y.o.   MRN: [id number]  HPI Madelina presents today as SDA for recent episodes of colicky RUQ pain that follow nearly every meal for the past 2 weeks.  Associated with nausea but no emesis. Also has loose stool 1 or 2 times following meals.  The symptoms are not associated with any particular dietary elements; can happen as easily with a salad as with a greasy meal.  Also occur after a single cup of coffee without food.  Similar episodes of abdominal discomfort in 2009 while pregnant, and again at the end of her pregnancy in 2013 (delivered in Oct 2013 by C/S).  Did not have symptoms in the interim between her pregnancies. Has had four similar episodes in the past year, each episode lasts from 1 to 2 weeks before spontaneous resolution.   Reviewed old records, prior US in 2009 without evidence of GB disease.   Social Hx; Never-smoker, no alcohol use.   ROS: No fevers/chills, no cough, no chest pain. No dysuria, no polyuria. Menses regular.    Review of Systems     Objective:   Physical Exam Well appearing, no apparent distress.  HEENT neck supple, no cervical adenopathy. MMM.  COR Regular S1S2, no extra sounds PULM: clear bilaterally, no rales or wheezes ABD Soft, POSITIVE MURPHY SIGN with palpation RUQ with inhalation. No organomegaly. Audible bowel sounds. No rebound. No CVA tenderness.        Assessment & Plan:

## 2013-11-18 NOTE — Patient Instructions (Signed)
It was a pleasure to see you today.  I believe your colicky pain is likely related to gallbladder disease.   We are getting labs today (metabolic panel, complete blood count) and an abdominal ultrasound.   I will follow up with you on your mobile with results.

## 2013-11-18 NOTE — Assessment & Plan Note (Signed)
History and exam suspicious for cholecystitis, intermittent colicky pain. For Abd [place] and CMet/CBC today. Will follow up with her on these results.  If no evidence of GB stones on [place], may want to proceed to HIDA.

## 2013-11-19 LAB — COMPREHENSIVE METABOLIC PANEL
ALBUMIN: 4.2 g/dL (ref 3.5–5.2)
ALT: 13 U/L (ref 0–35)
AST: 15 U/L (ref 0–37)
Alkaline Phosphatase: 65 U/L (ref 39–117)
BUN: 9 mg/dL (ref 6–23)
CALCIUM: 9 mg/dL (ref 8.4–10.5)
CHLORIDE: 103 meq/L (ref 96–112)
CO2: 25 mEq/L (ref 19–32)
CREATININE: 0.72 mg/dL (ref 0.50–1.10)
GLUCOSE: 76 mg/dL (ref 70–99)
POTASSIUM: 4.2 meq/L (ref 3.5–5.3)
Sodium: 138 mEq/L (ref 135–145)
Total Bilirubin: 0.6 mg/dL (ref 0.2–1.2)
Total Protein: 6.9 g/dL (ref 6.0–8.3)

## 2013-11-21 ENCOUNTER — Telehealth: Payer: Self-pay | Admitting: Family Medicine

## 2013-11-21 ENCOUNTER — Ambulatory Visit (HOSPITAL_COMMUNITY)
Admission: RE | Admit: 2013-11-21 | Discharge: 2013-11-21 | Disposition: A | Discharge status: Home / Self Care | Payer: BC Managed Care – PPO | Source: Ambulatory Visit | Attending: Family Medicine | Admitting: Family Medicine

## 2013-11-21 DIAGNOSIS — R1011 Right upper quadrant pain: Secondary | ICD-10-CM | POA: Insufficient documentation

## 2013-11-21 NOTE — Telephone Encounter (Signed)
Called patient's mobile and left message that the labs done on Friday (9/11) are unremarkable.  To await results of abd [place]. JB

## 2013-11-24 ENCOUNTER — Telehealth: Payer: Self-pay | Admitting: Family Medicine

## 2013-11-24 ENCOUNTER — Other Ambulatory Visit: Payer: Self-pay | Admitting: Family Medicine

## 2013-11-24 DIAGNOSIS — R1011 Right upper quadrant pain: Secondary | ICD-10-CM

## 2013-11-24 NOTE — Telephone Encounter (Signed)
Called patient's cell to report results of abd [place] which is unremarkable. Left brief voice message on cell phone.   JB

## 2013-11-29 ENCOUNTER — Encounter: Payer: Self-pay | Admitting: Physician Assistant

## 2013-12-08 HISTORY — PX: CHOLECYSTECTOMY: SHX55

## 2013-12-09 ENCOUNTER — Ambulatory Visit (INDEPENDENT_AMBULATORY_CARE_PROVIDER_SITE_OTHER): Payer: BC Managed Care – PPO | Admitting: Physician Assistant

## 2013-12-09 ENCOUNTER — Encounter: Payer: Self-pay | Admitting: Physician Assistant

## 2013-12-09 VITALS — BP 110/70 | HR 72 | Ht 61.0 in | Wt 160.5 lb

## 2013-12-09 DIAGNOSIS — R1011 Right upper quadrant pain: Secondary | ICD-10-CM

## 2013-12-09 DIAGNOSIS — R110 Nausea: Secondary | ICD-10-CM

## 2013-12-09 MED ORDER — PANTOPRAZOLE SODIUM 40 MG PO TBEC: DELAYED_RELEASE_TABLET | ORAL | Status: DC

## 2013-12-09 MED ORDER — HYOSCYAMINE SULFATE 0.125 MG SL SUBL: 0.1250 mg | SUBLINGUAL_TABLET | Freq: Four times a day (QID) | SUBLINGUAL | Status: DC | PRN

## 2013-12-09 NOTE — Patient Instructions (Signed)
You have been scheduled for a HIDA scan at [facility] (1st floor) on 12-15-2013. Please arrive 15 minutes prior to your scheduled appointment at  8:75 am. Make certain not to have anything to eat or drink at least 6 hours prior to your test. Should this appointment date or time not work well for you, please call radiology scheduling at [phone].  Do not take the Protonix the morning of the test.  We sent a prescription for Protonix ( pantoprazole sodium ) 40 mg and Levsin SL 0.125 mg dissolve 1 tab on the tongue as needed for cramping and spasms.   _____________________________________________________________________ hepatobiliary (HIDA) scan is an imaging procedure used to diagnose problems in the liver, gallbladder and bile ducts. In the HIDA scan, a radioactive chemical or tracer is injected into a vein in your arm. The tracer is handled by the liver like bile. Bile is a fluid produced and excreted by your liver that helps your digestive system break down fats in the foods you eat. Bile is stored in your gallbladder and the gallbladder releases the bile when you eat a meal. A special nuclear medicine scanner (gamma camera) tracks the flow of the tracer from your liver into your gallbladder and small intestine.  During your HIDA scan  You'll be asked to change into a hospital gown before your HIDA scan begins. Your health care team will position you on a table, usually on your back. The radioactive tracer is then injected into a vein in your arm.The tracer travels through your bloodstream to your liver, where it's taken up by the bile-producing cells. The radioactive tracer travels with the bile from your liver into your gallbladder and through your bile ducts to your small intestine.You may feel some pressure while the radioactive tracer is injected into your vein. As you lie on the table, a special gamma camera is positioned over your abdomen taking pictures of the tracer as it moves  through your body. The gamma camera takes pictures continually for about an hour. You'll need to keep still during the HIDA scan. This can become uncomfortable, but you may find that you can lessen the discomfort by taking deep breaths and thinking about other things. Tell your health care team if you're uncomfortable. The radiologist will watch on a computer the progress of the radioactive tracer through your body. The HIDA scan may be stopped when the radioactive tracer is seen in the gallbladder and enters your small intestine. This typically takes about an hour. In some cases extra imaging will be performed if original images aren't satisfactory, if morphine is given to help visualize the gallbladder or if the medication CCK is given to look at the contraction of the gallbladder. This test typically takes 2 hours to complete. ________________________________________________________________________

## 2013-12-09 NOTE — Progress Notes (Signed)
Subjective:    Patient ID: Kelsey [name], female    DOB: June 01, 1980, 33 y.o.   MRN: [id number]  HPI  Nolita is a 33 year old white female referred today by Dr. Mallie Mussel for evaluation of recurrent right upper quadrant pain. Patient states she initially experienced this pain while she was pregnant with her first child and says the pain was sharp off and on towards the end of her pregnancy. She had another child about 2 years ago and says she felt that some of the same pain again towards the end of the pregnancy and then it resolved. Now over the past 2 years she has noted intermittent right upper quadrant pain which are sharp and radiates around into her back most of the time. Over the past 3 weeks she has had increased frequency of symptoms. She says she had a bad episode a couple of weeks ago that lasted for about 5 days. She would have sharp pain associated with nausea and then usually loose stool. She says she feels full and her upper abdomen and right upper abdomen and somewhat queasy most of the time. No fevers or chills. In between episodes of pain her bowel movements are normal she has not noticed any food intolerances .No regular aspirin or NSAIDs. No heartburn or indigestion She is status post C-section x2 Upper abdominal ultrasound on 11/21/2013 shows no gallstones no gallbladder wall thickening common bile duct of 3.6 mm and visualized portion of the pancreas unremarkable she also had a CBC and see meds done during this last episode of pain in these were both normal    Review of Systems  Constitutional: Negative.   HENT: Negative.   Eyes: Negative.   Respiratory: Negative.   Cardiovascular: Negative.   Gastrointestinal: Positive for nausea, abdominal pain and diarrhea.  Endocrine: Negative.   Genitourinary: Negative.   Musculoskeletal: Negative.   Skin: Negative.   Allergic/Immunologic: Negative.   Neurological: Negative.   Hematological: Negative.   Psychiatric/Behavioral:  Negative.    Outpatient Prescriptions Prior to Visit  Medication Sig Dispense Refill  . acetaminophen (TYLENOL) 325 MG tablet Take 650 mg by mouth every 6 (six) hours as needed. pain      . ibuprofen (ADVIL,MOTRIN) 600 MG tablet Take 1 tablet (600 mg total) by mouth every 6 (six) hours as needed.  30 tablet  0  . ondansetron (ZOFRAN) 8 MG tablet Take 1 tablet (8 mg total) by mouth every 8 (eight) hours as needed for nausea.  20 tablet  0  . oxyCODONE-acetaminophen (PERCOCET/ROXICET) 5-325 MG per tablet Take 1-2 tablets by mouth every 4 (four) hours as needed (moderate - severe pain).  30 tablet  0  . Prenatal Vit-Fe Fumarate-FA (PRENATAL MULTIVITAMIN) TABS Take 1 tablet by mouth daily.       No facility-administered medications prior to visit.   Allergies  Allergen Reactions  . Sulfonamide Derivatives Rash   Patient Active Problem List   Diagnosis Date Noted  . Colicky RUQ abdominal pain 11/18/2013  . H/O one miscarriage 11/20/2011  . Cervical radiculopathy at C6 11/20/2011  . ALLERGIC RHINITIS, SEASONAL 12/11/2008   History  Substance Use Topics  . Smoking status: Never Smoker   . Smokeless tobacco: Not on file  . Alcohol Use: No   family history is not on file.     Objective:   Physical Exam  well-developed young white female in no acute distress, pleasant blood pressure 110/70 pulse 72 height 5 foot 1 weight 160. HEENT;  nontraumatic normocephalic EOMI PERRLA sclera anicteric, Supple ;no JVD, Cardiovascular; regular rate and rhythm with S1-S2 no murmur or gallop, Pulmonary; clear bilaterally, Abdomen ;soft she is mildly tender in the right upper quadrant and epigastrium there is no guarding or rebound no palpable mass or hepatosplenomegaly bowel sounds are present, Rectal ;exam not done, Extremities ;no clubbing cyanosis or edema skin warm and dry, Psych ;mood and affect appropriate        Assessment & Plan:  #51  33 year old female with intermittent sharp right upper  quadrant pain associated with nausea and occasional diarrhea initially occurring during pregnancy and now with increasing frequency over the past 3 weeks. Pain is colicky and consistent with biliary symptoms though ultrasound is negative, also consider IBS with spasm  Plan; will check CCK HIDA scan Trial of Protonix 40 mg by mouth every morning x4-6 weeks Levsin sublingual dissolve on tongue every 4-6 hours as needed for pain Further workup pending results of HIDA scan Patient will be establish with Dr. Deatra Ina

## 2013-12-12 NOTE — Progress Notes (Signed)
Reviewed and agree with management. Julena Barbour D. Adalynne Steffensmeier, M.D., FACG  

## 2013-12-15 ENCOUNTER — Encounter (HOSPITAL_COMMUNITY)
Admission: RE | Admit: 2013-12-15 | Discharge: 2013-12-15 | Disposition: A | Discharge status: Home / Self Care | Payer: BC Managed Care – PPO | Source: Ambulatory Visit | Attending: Physician Assistant | Admitting: Physician Assistant

## 2013-12-15 ENCOUNTER — Telehealth: Payer: Self-pay | Admitting: *Deleted

## 2013-12-15 DIAGNOSIS — R110 Nausea: Secondary | ICD-10-CM | POA: Insufficient documentation

## 2013-12-15 DIAGNOSIS — R1011 Right upper quadrant pain: Secondary | ICD-10-CM | POA: Diagnosis present

## 2013-12-15 MED ORDER — SINCALIDE 5 MCG IJ SOLR
0.0200 ug/kg | Freq: Once | INTRAMUSCULAR | Status: AC
  Administered 2013-12-15: 1.5 ug via INTRAVENOUS

## 2013-12-15 MED ORDER — TECHNETIUM TC 99M MEBROFENIN IV KIT
5.0000 | PACK | Freq: Once | INTRAVENOUS | Status: AC | PRN
  Administered 2013-12-15: 5 via INTRAVENOUS

## 2013-12-15 NOTE — Telephone Encounter (Signed)
Patient is scheduled on 12/29/13 at 11:00 AM with Dr. Justine Null per Davy Pique. Patient aware.

## 2013-12-15 NOTE — Telephone Encounter (Signed)
error 

## 2013-12-20 ENCOUNTER — Ambulatory Visit (INDEPENDENT_AMBULATORY_CARE_PROVIDER_SITE_OTHER): Payer: Self-pay | Admitting: General Surgery

## 2013-12-23 ENCOUNTER — Other Ambulatory Visit (INDEPENDENT_AMBULATORY_CARE_PROVIDER_SITE_OTHER): Payer: Self-pay | Admitting: General Surgery

## 2014-01-09 ENCOUNTER — Encounter: Payer: Self-pay | Admitting: Physician Assistant

## 2014-05-01 ENCOUNTER — Ambulatory Visit (INDEPENDENT_AMBULATORY_CARE_PROVIDER_SITE_OTHER): Payer: BLUE CROSS/BLUE SHIELD | Admitting: Family Medicine

## 2014-05-01 VITALS — BP 119/78 | HR 75 | Temp 98.1°F | Ht 61.0 in | Wt 159.0 lb

## 2014-05-01 DIAGNOSIS — J029 Acute pharyngitis, unspecified: Secondary | ICD-10-CM

## 2014-05-01 LAB — POCT RAPID STREP A (OFFICE): Rapid Strep A Screen: NEGATIVE

## 2014-05-01 MED ORDER — AMOXICILLIN 500 MG PO CAPS: 1000.0000 mg | ORAL_CAPSULE | Freq: Every day | ORAL | Status: DC

## 2014-05-01 NOTE — Assessment & Plan Note (Addendum)
Acute arthritis, Streptococcus negative Has tonsillar exudates, tender lymphadenopathy, small children, and no cough Will treat empirically with amoxicillin considering only 70% sensitivity of rapid strep test Provided red flags for return

## 2014-05-01 NOTE — Progress Notes (Signed)
Patient ID: Kelsey [name], female   DOB: 04-13-1980, 34 y.o.   MRN: [id number]   HPI  Patient presents today for sore throat  Patient explains that she was ill last week with rhinorrhea and mild cough. 2 days ago she developed a steadily worsening sore throat. She denies malaise or fever. She does have tender lymphadenopathy, very sore throat, and states that her cough has pretty much resolved.  She denies any overt Streptococcus exposure. She has a 60-year-old and 1-year-old better in daycare and only to school respectively.  She works in Restaurant manager, fast food for a [employer]  ROS: Per HPI  Objective: BP 119/78 mmHg  Pulse 75  Temp(Src) 98.1 F (36.7 C) (Oral)  Ht 5\' 1"  (1.549 m)  Wt 159 lb (72.122 kg)  BMI 30.06 kg/m2 Gen: NAD, alert, cooperative with exam HEENT: NCAT, left-sided tonsillar exudates, left-sided tender lymphadenopathy CV: RRR, good S1/S2, no murmur Neuro: Alert and oriented, No gross deficits  Assessment and plan:  Acute pharyngitis Acute arthritis, Streptococcus negative Has tonsillar exudates, tender lymphadenopathy, small children, and no cough Will treat empirically with amoxicillin considering only 70% sensitivity of rapid strep test Provided red flags for return     Orders Placed This Encounter  Procedures  . POCT rapid strep A    Meds ordered this encounter  Medications  . amoxicillin (AMOXIL) 500 MG capsule    Sig: Take 2 capsules (1,000 mg total) by mouth daily.    Dispense:  20 capsule    Refill:  0

## 2014-05-01 NOTE — Patient Instructions (Signed)
Great to meet you officially!  I will treat you for strep throat, Take 2 pills once daily for 10 days.   Practice good handwashing.   Strep Throat Strep throat is an infection of the throat caused by a bacteria named Streptococcus pyogenes. Your health care provider may call the infection streptococcal "tonsillitis" or "pharyngitis" depending on whether there are signs of inflammation in the tonsils or back of the throat. Strep throat is most common in children aged 5-15 years during the cold months of the year, but it can occur in people of any age during any season. This infection is spread from person to person (contagious) through coughing, sneezing, or other close contact. SIGNS AND SYMPTOMS   Fever or chills.  Painful, swollen, red tonsils or throat.  Pain or difficulty when swallowing.  White or yellow spots on the tonsils or throat.  Swollen, tender lymph nodes or "glands" of the neck or under the jaw.  Red rash all over the body (rare). DIAGNOSIS  Many different infections can cause the same symptoms. A test must be done to confirm the diagnosis so the right treatment can be given. A "rapid strep test" can help your health care provider make the diagnosis in a few minutes. If this test is not available, a light swab of the infected area can be used for a throat culture test. If a throat culture test is done, results are usually available in a day or two. TREATMENT  Strep throat is treated with antibiotic medicine. HOME CARE INSTRUCTIONS   Gargle with 1 tsp of salt in 1 cup of warm water, 3-4 times per day or as needed for comfort.  Family members who also have a sore throat or fever should be tested for strep throat and treated with antibiotics if they have the strep infection.  Make sure everyone in your household washes their hands well.  Do not share food, drinking cups, or personal items that could cause the infection to spread to others.  You may need to eat a soft  food diet until your sore throat gets better.  Drink enough water and fluids to keep your urine clear or pale yellow. This will help prevent dehydration.  Get plenty of rest.  Stay home from school, day care, or work until you have been on antibiotics for 24 hours.  Take medicines only as directed by your health care provider.  Take your antibiotic medicine as directed by your health care provider. Finish it even if you start to feel better. SEEK MEDICAL CARE IF:   The glands in your neck continue to enlarge.  You develop a rash, cough, or earache.  You cough up green, yellow-brown, or bloody sputum.  You have pain or discomfort not controlled by medicines.  Your problems seem to be getting worse rather than better.  You have a fever. SEEK IMMEDIATE MEDICAL CARE IF:   You develop any new symptoms such as vomiting, severe headache, stiff or painful neck, chest pain, shortness of breath, or trouble swallowing.  You develop severe throat pain, drooling, or changes in your voice.  You develop swelling of the neck, or the skin on the neck becomes red and tender.  You develop signs of dehydration, such as fatigue, dry mouth, and decreased urination.  You become increasingly sleepy, or you cannot wake up completely. MAKE SURE YOU:  Understand these instructions.  Will watch your condition.  Will get help right away if you are not doing well or get  worse. Document Released: 02/22/2000 Document Revised: 07/11/2013 Document Reviewed: 04/25/2010 [facility] Patient Information 2015 [place], [place]. This information is not intended to replace advice given to you by your health care provider. Make sure you discuss any questions you have with your health care provider.

## 2014-08-15 ENCOUNTER — Ambulatory Visit (INDEPENDENT_AMBULATORY_CARE_PROVIDER_SITE_OTHER): Payer: BLUE CROSS/BLUE SHIELD | Admitting: Family Medicine

## 2014-08-15 ENCOUNTER — Encounter: Payer: Self-pay | Admitting: Family Medicine

## 2014-08-15 VITALS — BP 112/58 | HR 81 | Temp 98.3°F

## 2014-08-15 DIAGNOSIS — R1012 Left upper quadrant pain: Secondary | ICD-10-CM

## 2014-08-15 LAB — CBC WITH DIFFERENTIAL/PLATELET
BASOS ABS: 0.1 10*3/uL (ref 0.0–0.1)
BASOS PCT: 1 % (ref 0–1)
Eosinophils Absolute: 0.3 10*3/uL (ref 0.0–0.7)
Eosinophils Relative: 3 % (ref 0–5)
HEMATOCRIT: 39.3 % (ref 36.0–46.0)
HEMOGLOBIN: 13.6 g/dL (ref 12.0–15.0)
LYMPHS PCT: 26 % (ref 12–46)
Lymphs Abs: 2.7 10*3/uL (ref 0.7–4.0)
MCH: 31.1 pg (ref 26.0–34.0)
MCHC: 34.6 g/dL (ref 30.0–36.0)
MCV: 89.7 fL (ref 78.0–100.0)
MONOS PCT: 9 % (ref 3–12)
MPV: 10 fL (ref 8.6–12.4)
Monocytes Absolute: 0.9 10*3/uL (ref 0.1–1.0)
NEUTROS PCT: 61 % (ref 43–77)
Neutro Abs: 6.4 10*3/uL (ref 1.7–7.7)
PLATELETS: 257 10*3/uL (ref 150–400)
RBC: 4.38 MIL/uL (ref 3.87–5.11)
RDW: 13.1 % (ref 11.5–15.5)
WBC: 10.5 10*3/uL (ref 4.0–10.5)

## 2014-08-15 NOTE — Assessment & Plan Note (Addendum)
Kelsey [name] is a 34 y.o. Caucasian female with a two-month history of intermittent left upper quadrant pain. Uncertain etiology of pain, differential diagnosis would be pancreatitis, splenic pathology or colon pathology. Family History of esophageal cancer. - Labs: CBC, CMP, lipase and fasting lipids. - Image: CT abdomen/pelvis with contrast. - Follow-up with PCP in 1-2 weeks, sooner if pain returns, pain is unable to be controlled with over-the-counter medication, unable to tolerate by mouth, becomes febrile or has bowel changes.Marland Kitchen

## 2014-08-15 NOTE — Progress Notes (Signed)
   Subjective:    Patient ID: Kelsey [name], female    DOB: 01/04/1981, 34 y.o.   MRN: [id number]  HPI  Left upper quadrant pain: Patient presents to family medicine clinic, same day appointment with a 2-3 month history of intermittent left upper quadrant pain. She states that the pain is sharp and grabs/squeezes. The pain will last approximately 3 minutes, and then leave residual soreness for the next few days. Patient recently had her gallbladder out in October 2015, for dysfunctional gallbladder, no gallstones were identified by ultrasound per patient. She states she only drinks a glass of wine 3-4 times a year, and had a glass of wine on Sunday. She denies any fever, nausea, vomit or stool changes. Her bowel movement occurs daily, and is normal without straining or diarrhea. She denies any family history of pancreatitis, Crohn's disease, ulcerative colitis or colon cancers. She reports her mother had esophageal cancer and passed away at 70. Patient states the pain occurred earlier this morning, and the majority the pain is gone but she still feels tender. No weight loss.  Never smoker Past Medical History  Diagnosis Date  . PONV (postoperative nausea and vomiting)   . Pregnancy induced hypertension   . Headache(784.0)    Allergies  Allergen Reactions  . Sulfonamide Derivatives Rash   Past Surgical History  Procedure Laterality Date  . Cesarean section  07/24/2006  . Wisdom tooth extraction  2006  . Dilation and evacuation  10/04/2010    Procedure: DILATATION AND EVACUATION (D&E);  Surgeon: Marylynn Pearson;  Location: [facility];  Service: Gynecology;  Laterality: N/A;  pt last ate at 8:30am   No family history on file.   Review of Systems Per HPI     Objective:   Physical Exam BP 112/58 mmHg  Pulse 81  Temp(Src) 98.3 F (36.8 C) (Oral)  LMP 08/01/2014  Gen: NAD. Nontoxic in appearance, well-developed, well-nourished, Caucasian female. The overweight. HEENT: AT. [place].  Bilateral  eyes without injections or icterus. MMM.  Abd: Soft. Flat. ND. Tender to moderate palpation left upper quadrant. BS present, hypoactive. No Masses palpated. Mild stool burden palpated.     Assessment & Plan:  Kelsey [name] is a 34 y.o. Caucasian female with a two-month history of intermittent left upper quadrant pain. Uncertain etiology of pain, differential diagnosis would be pancreatitis, splenic pathology or colon pathology. Family History of esophageal cancer. - Labs: CBC, CMP, lipase and fasting lipids. - Image: CT abdomen/pelvis with contrast. - Follow-up with PCP in 1-2 weeks, sooner if pain returns, pain is unable to be controlled with over-the-counter medication, unable to tolerate by mouth, becomes febrile or has bowel changes.

## 2014-08-15 NOTE — Patient Instructions (Signed)
Acute Pancreatitis Acute pancreatitis is a disease in which the pancreas becomes suddenly inflamed. The pancreas is a large gland located behind your stomach. The pancreas produces enzymes that help digest food. The pancreas also releases the hormones glucagon and insulin that help regulate blood sugar. Damage to the pancreas occurs when the digestive enzymes from the pancreas are activated and begin attacking the pancreas before being released into the intestine. Most acute attacks last a couple of days and can cause serious complications. Some people become dehydrated and develop low blood pressure. In severe cases, bleeding into the pancreas can lead to shock and can be life-threatening. The lungs, heart, and kidneys may fail. CAUSES  Pancreatitis can happen to anyone. In some cases, the cause is unknown. Most cases are caused by:  Alcohol abuse.  Gallstones. Other less common causes are:  Certain medicines.  Exposure to certain chemicals.  Infection.  Damage caused by an accident (trauma).  Abdominal surgery. SYMPTOMS   Pain in the upper abdomen that may radiate to the back.  Tenderness and swelling of the abdomen.  Nausea and vomiting. DIAGNOSIS  Your caregiver will perform a physical exam. Blood and stool tests may be done to confirm the diagnosis. Imaging tests may also be done, such as X-rays, CT scans, or an ultrasound of the abdomen. TREATMENT  Treatment usually requires a stay in the hospital. Treatment may include:  Pain medicine.  Fluid replacement through an intravenous line (IV).  Placing a tube in the stomach to remove stomach contents and control vomiting.  Not eating for 3 or 4 days. This gives your pancreas a rest, because enzymes are not being produced that can cause further damage.  Antibiotic medicines if your condition is caused by an infection.  Surgery of the pancreas or gallbladder. HOME CARE INSTRUCTIONS   Follow the diet advised by your  caregiver. This may involve avoiding alcohol and decreasing the amount of fat in your diet.  Eat smaller, more frequent meals. This reduces the amount of digestive juices the pancreas produces.  Drink enough fluids to keep your urine clear or pale yellow.  Only take over-the-counter or prescription medicines as directed by your caregiver.  Avoid drinking alcohol if it caused your condition.  Do not smoke.  Get plenty of rest.  Check your blood sugar at home as directed by your caregiver.  Keep all follow-up appointments as directed by your caregiver. SEEK MEDICAL CARE IF:   You do not recover as quickly as expected.  You develop new or worsening symptoms.  You have persistent pain, weakness, or nausea.  You recover and then have another episode of pain. SEEK IMMEDIATE MEDICAL CARE IF:   You are unable to eat or keep fluids down.  Your pain becomes severe.  You have a fever or persistent symptoms for more than 2 to 3 days.  You have a fever and your symptoms suddenly get worse.  Your skin or the white part of your eyes turn yellow (jaundice).  You develop vomiting.  You feel dizzy, or you faint.  Your blood sugar is high (over 300 mg/dL). MAKE SURE YOU:   Understand these instructions.  Will watch your condition.  Will get help right away if you are not doing well or get worse. Document Released: 02/24/2005 Document Revised: 08/26/2011 Document Reviewed: 06/05/2011 ExitCare Patient Information 2015 [employer]. This information is not intended to replace advice given to you by your health care provider. Make sure you discuss any questions you have   with your health care provider.   We will collect some lab today, a CBC, CMP and lipase. I would like you to return in the morning fasting to have your lipids/cholesterol completed. For pain try some naproxen/Aleve, if you're unable to tolerate anything by mouth or your pain is unable to be controlled please be seen  immediately. Please make an appointment to follow up with your primary care provider within the next 2 weeks.

## 2014-08-16 ENCOUNTER — Telehealth: Payer: Self-pay | Admitting: Family Medicine

## 2014-08-16 ENCOUNTER — Encounter: Payer: Self-pay | Admitting: Family Medicine

## 2014-08-16 LAB — COMPREHENSIVE METABOLIC PANEL
ALK PHOS: 79 U/L (ref 39–117)
ALT: 15 U/L (ref 0–35)
AST: 15 U/L (ref 0–37)
Albumin: 3.9 g/dL (ref 3.5–5.2)
BUN: 11 mg/dL (ref 6–23)
CALCIUM: 9.6 mg/dL (ref 8.4–10.5)
CO2: 30 mEq/L (ref 19–32)
CREATININE: 0.85 mg/dL (ref 0.50–1.10)
Chloride: 101 mEq/L (ref 96–112)
GLUCOSE: 85 mg/dL (ref 70–99)
Potassium: 3.8 mEq/L (ref 3.5–5.3)
Sodium: 138 mEq/L (ref 135–145)
Total Bilirubin: 0.5 mg/dL (ref 0.2–1.2)
Total Protein: 7.1 g/dL (ref 6.0–8.3)

## 2014-08-16 LAB — LIPASE: Lipase: 22 U/L (ref 0–75)

## 2014-08-16 NOTE — Telephone Encounter (Signed)
Attempted to call pt to inform her, all of her lab work so far is normal. I will send a copy of these to her home as well.  Please call her and inform her of the above, since this is an acute issue I want her to have the information ASAP. We will call her again once she has the CT completed and we have the results of CT and lipids. Thanks.

## 2014-08-16 NOTE — Telephone Encounter (Signed)
Pt is aware of results. Kelsey [name],CMA  

## 2014-08-17 ENCOUNTER — Encounter: Payer: Self-pay | Admitting: Family Medicine

## 2014-08-17 ENCOUNTER — Ambulatory Visit (HOSPITAL_COMMUNITY)
Admission: RE | Admit: 2014-08-17 | Discharge: 2014-08-17 | Disposition: A | Discharge status: Home / Self Care | Payer: BLUE CROSS/BLUE SHIELD | Source: Ambulatory Visit | Attending: Family Medicine | Admitting: Family Medicine

## 2014-08-17 ENCOUNTER — Telehealth: Payer: Self-pay | Admitting: Family Medicine

## 2014-08-17 ENCOUNTER — Encounter (HOSPITAL_COMMUNITY): Payer: Self-pay

## 2014-08-17 DIAGNOSIS — R1012 Left upper quadrant pain: Secondary | ICD-10-CM | POA: Diagnosis not present

## 2014-08-17 DIAGNOSIS — Z9049 Acquired absence of other specified parts of digestive tract: Secondary | ICD-10-CM | POA: Diagnosis not present

## 2014-08-17 MED ORDER — DOBUTAMINE INFUSION FOR EP/ECHO/NUC (1000 MCG/ML)
INTRAVENOUS | Status: AC
  Filled 2014-08-17: qty 250

## 2014-08-17 MED ORDER — IOHEXOL 300 MG/ML  SOLN
100.0000 mL | Freq: Once | INTRAMUSCULAR | Status: AC | PRN
Start: 2014-08-17 — End: 2014-08-17
  Administered 2014-08-17: 100 mL via INTRAVENOUS

## 2014-08-17 MED ORDER — IOHEXOL 300 MG/ML  SOLN: 100.0000 mL | Freq: Once | INTRAMUSCULAR | Status: AC | PRN

## 2014-08-17 NOTE — Telephone Encounter (Signed)
Please call patient. I attempted to call her this evening to inform her of her CAT scan results, there was no answer on her phone. There is a message in the chart not to leave messages and to only speak to her personally. Her CT scan was normal. Her blood work from the other day was normal. I will send her a copy of her CT scan for her records. We are uncertain the cause of her pain, if he continues please have her follow-up with her primary care physician. I will also forward this to Dr. Nori Riis who is her primary care physician for any further recommendations. Thanks.

## 2014-08-18 NOTE — Telephone Encounter (Signed)
Contacted pt and informed her of below. Zimmerman Rumple, Azaliah Carrero D, CMA  

## 2014-08-22 ENCOUNTER — Other Ambulatory Visit: Payer: BLUE CROSS/BLUE SHIELD

## 2014-09-06 ENCOUNTER — Ambulatory Visit: Payer: BLUE CROSS/BLUE SHIELD | Admitting: Family Medicine

## 2014-09-23 ENCOUNTER — Emergency Department
Admission: EM | Admit: 2014-09-23 | Discharge: 2014-09-23 | Disposition: A | Discharge status: Home / Self Care | Payer: BLUE CROSS/BLUE SHIELD | Source: Home / Self Care | Attending: Family Medicine | Admitting: Family Medicine

## 2014-09-23 DIAGNOSIS — R110 Nausea: Secondary | ICD-10-CM

## 2014-09-23 DIAGNOSIS — R197 Diarrhea, unspecified: Secondary | ICD-10-CM

## 2014-09-23 LAB — POCT CBC W AUTO DIFF (K'VILLE URGENT CARE)

## 2014-09-23 LAB — POCT URINALYSIS DIP (MANUAL ENTRY)
GLUCOSE UA: NEGATIVE
Leukocytes, UA: NEGATIVE
Nitrite, UA: NEGATIVE
Protein Ur, POC: 30 — AB
SPEC GRAV UA: 1.02
Urobilinogen, UA: 0.2
pH, UA: 6.5

## 2014-09-23 MED ORDER — ONDANSETRON 4 MG PO TBDP: ORAL_TABLET | ORAL | Status: DC

## 2014-09-23 NOTE — ED Notes (Signed)
Patient here today with abdominal pain, nausea and diarrhea. States that she did have her gallbladder removed last fall and sometimes has a bad day but only for a day, this has lasted several. She has tried imodium but it did not help.

## 2014-09-23 NOTE — ED Provider Notes (Signed)
CSN: [id number]     Arrival date & time 09/23/14  3220 History   First MD Initiated Contact with Patient 09/23/14 1007     Chief Complaint  Patient presents with  . Abdominal Pain    Nausea and Diarrhea      HPI Comments: Patient awoke at 4am four days ago with abdominal cramps, followed by nausea without vomiting, and watery diarrhea.  She has had chills/sweats, but no fever.  No hematochezia.  Bowel movements had been normal prior to present illness.  Denies recent foreign travel, or drinking untreated water in a wilderness environment.  She denies recent antibiotic use.  Past history of cholecystectomy in October 2015.  Surgical history also includes two c-sections and BTL.  Patient is a 34 y.o. female presenting with diarrhea. The history is provided by the patient.  Diarrhea Quality:  Watery Severity:  Moderate Onset quality:  Sudden Duration:  4 days Timing:  Intermittent Progression:  Unchanged Relieved by:  Nothing Exacerbated by: eating. Ineffective treatments: Imodium. Associated symptoms: abdominal pain, chills and diaphoresis   Associated symptoms: no fever, no headaches, no myalgias and no vomiting   Risk factors: no recent antibiotic use, no sick contacts, no suspicious food intake and no travel to endemic areas     Past Medical History  Diagnosis Date  . PONV (postoperative nausea and vomiting)   . Pregnancy induced hypertension   . URKYHCWC(376.2)    Past Surgical History  Procedure Laterality Date  . Cesarean section  07/24/2006  . Wisdom tooth extraction  2006  . Dilation and evacuation  10/04/2010    Procedure: DILATATION AND EVACUATION (D&E);  Surgeon: Marylynn Pearson;  Location: [facility];  Service: Gynecology;  Laterality: N/A;  pt last ate at 8:30am   No family history on file. History  Substance Use Topics  . Smoking status: Never Smoker   . Smokeless tobacco: Not on file  . Alcohol Use: No   OB History    Gravida Para Term Preterm AB TAB SAB Ectopic  Multiple Living   3 2 2  1  1   2      Review of Systems  Constitutional: Positive for chills and diaphoresis. Negative for fever.  Gastrointestinal: Positive for abdominal pain and diarrhea. Negative for vomiting.  Musculoskeletal: Negative for myalgias.  Neurological: Negative for headaches.  All other systems reviewed and are negative.   Allergies  Sulfonamide derivatives  Home Medications   Prior to Admission medications   Not on File   BP 110/62 mmHg  Pulse 85  Temp(Src) 97.9 F (36.6 C)  Ht 5\' 1"  (1.549 m)  Wt 157 lb (71.215 kg)  BMI 29.68 kg/m2  SpO2 98% Physical Exam Nursing notes and Vital Signs reviewed. Appearance:  Patient appears stated age, and in no acute distress Eyes:  Pupils are equal, round, and reactive to light and accomodation.  Extraocular movement is intact.  Conjunctivae are not inflamed  Ears:  Canals normal.  Tympanic membranes normal.   Nose:   Normal turbinates.  No sinus tenderness.  Pharynx:  Normal Neck:  Supple.  No adenopathy Lungs:  Clear to auscultation.  Breath sounds are equal.  Moving air well. Heart:  Regular rate and rhythm without murmurs, rubs, or gallops.  Abdomen:  Nontender without masses or hepatosplenomegaly.  Bowel sounds are present.  No CVA or flank tenderness.  Extremities:  No edema.  No calf tenderness Skin:  No rash present.   ED Course  Procedures  None  Labs Reviewed  POCT URINALYSIS DIP (MANUAL ENTRY) - Abnormal; Notable for the following:    Color, UA other (*)    Bilirubin, UA moderate (*)    Ketones, POC UA trace (5) (*)    Blood, UA moderate (*)    Protein Ur, POC =30 (*)    All other components within normal limits  POCT CBC W AUTO DIFF ([facility]) - Normal (WBC 8.2)  URINE CULTURE         MDM   1. Nausea without vomiting; suspect viral gastroenteritis   2. Diarrhea    Zofran 4mg  ODT administered Rx for Zofran ODT 4mg  Begin clear liquids (Pedialyte while having diarrhea) until  improved, then advance to a [employer] (Bananas, Rice, Applesauce, Toast).  Then gradually resume a regular diet when tolerated.  Avoid milk products until well.  To decrease diarrhea, mix one teaspoon Citrucel (methylcellulose) in 2 oz water and drink one to three times daily.  Do not drink extra fluids with this dose and do not drink fluids for one hour afterwards.  If symptoms become significantly worse during the night or over the weekend, proceed to the local emergency room.     Kandra Nicolas, MD 09/23/14 [phone]

## 2014-09-23 NOTE — Discharge Instructions (Signed)
Begin clear liquids (Pedialyte while having diarrhea) until improved, then advance to a [employer] (Bananas, Rice, Applesauce, Toast).  Then gradually resume a regular diet when tolerated.  Avoid milk products until well.  To decrease diarrhea, mix one teaspoon Citrucel (methylcellulose) in 2 oz water and drink one to three times daily.  Do not drink extra fluids with this dose and do not drink fluids for one hour afterwards.  If symptoms become significantly worse during the night or over the weekend, proceed to the local emergency room.

## 2014-09-24 LAB — URINE CULTURE: Colony Count: >=100000

## 2014-09-25 ENCOUNTER — Telehealth: Payer: Self-pay | Admitting: Emergency Medicine

## 2014-09-26 ENCOUNTER — Ambulatory Visit: Payer: BLUE CROSS/BLUE SHIELD | Admitting: Internal Medicine

## 2014-10-16 ENCOUNTER — Encounter: Payer: Self-pay | Admitting: Gastroenterology

## 2014-10-16 ENCOUNTER — Ambulatory Visit (INDEPENDENT_AMBULATORY_CARE_PROVIDER_SITE_OTHER): Payer: BLUE CROSS/BLUE SHIELD | Admitting: Gastroenterology

## 2014-10-16 VITALS — BP 108/74 | HR 84 | Ht 61.0 in | Wt 160.1 lb

## 2014-10-16 DIAGNOSIS — R197 Diarrhea, unspecified: Secondary | ICD-10-CM | POA: Diagnosis not present

## 2014-10-16 MED ORDER — CHOLESTYRAMINE 4 GM/DOSE PO POWD: 4.0000 g | Freq: Every day | ORAL | Status: DC

## 2014-10-16 NOTE — Progress Notes (Signed)
Review of pertinent gastrointestinal problems: 1. RUQ pains; Upper abdominal ultrasound on 11/21/2013 shows no gallstones no gallbladder wall thickening common bile duct of 3.6 mm and visualized portion of the pancreas unremarkable she also had a CBC and see meds done during this last episode of pain in these were both normal; 12/2013 HIDA scan showed EF 12%.  Lap chole Dr. Rosendo Gros   HPI: This is a    very pleasant 34 year old woman whom I am meeting for the first time today. She was here in our office last fall for right upper quadrant pains, intermittent diarrhea. Found to have dysfunctional gallbladder and eventually had upper scopic cholecystectomy.  Chief complaint is  intermittent episodes of diarrhea, epigastric discomfort  A few weeks ago, diarrhea.  She has been avoiding fried foods.  She will have   She does not have RUQ pains, aching anymore.  That has stopped since GB surgery.  Intermittent epigastric pains intermittently.  Episodically (can last for 2-3 weeks).     Past Medical History  Diagnosis Date  . PONV (postoperative nausea and vomiting)   . Pregnancy induced hypertension   . TDDUKGUR(427.0)     Past Surgical History  Procedure Laterality Date  . Cesarean section  07/24/2006    2013  . Wisdom tooth extraction  2006  . Dilation and evacuation  10/04/2010    Procedure: DILATATION AND EVACUATION (D&E);  Surgeon: Marylynn Pearson;  Location: [facility];  Service: Gynecology;  Laterality: N/A;  pt last ate at 8:30am  . Cholecystectomy  12/2013    No current outpatient prescriptions on file.   No current facility-administered medications for this visit.    Allergies as of 10/16/2014 - Review Complete 10/16/2014  Allergen Reaction Noted  . Sulfonamide derivatives Rash 04/06/2008    History reviewed. No pertinent family history.  History   Social History  . Marital Status: Married    Spouse Name: N/A  . Number of Children: N/A  . Years of Education: N/A    Occupational History  . Not on file.   Social History Main Topics  . Smoking status: Never Smoker   . Smokeless tobacco: Not on file  . Alcohol Use: No  . Drug Use: No  . Sexual Activity: Yes   Other Topics Concern  . Not on file   Social History Narrative     Physical Exam: BP 108/74 mmHg  Pulse 84  Ht 5\' 1"  (1.549 m)  Wt 160 lb 2 oz (72.632 kg)  BMI 30.27 kg/m2 Constitutional: generally well-appearing Psychiatric: alert and oriented x3 Abdomen: soft, nontender, nondistended, no obvious ascites, no peritoneal signs, normal bowel sounds   Assessment and plan: 34 y.o. female with  intermittent loose stools, epigastric discomfort I think the loose stools episodes which she has been having may be related to her gallbladder removal. I explained that cholestyramine powder can help with this usually and she is going to try one dose once daily and she will call to report on her response in 6-7 weeks. If she is no better at that point and I will consider other testing.   Owens Loffler, MD [facility] 10/16/2014, 9:53 AM

## 2014-10-16 NOTE — Patient Instructions (Signed)
Trial of cholestyramine powder, one dose once daily. Call in 6-8 weeks to report on your response.

## 2014-10-24 ENCOUNTER — Ambulatory Visit: Payer: BLUE CROSS/BLUE SHIELD | Admitting: Internal Medicine

## 2014-12-05 ENCOUNTER — Other Ambulatory Visit: Payer: Self-pay

## 2014-12-05 MED ORDER — CHOLESTYRAMINE 4 GM/DOSE PO POWD: 4.0000 g | Freq: Every day | ORAL | Status: DC

## 2015-05-02 ENCOUNTER — Encounter: Payer: Self-pay | Admitting: Family Medicine

## 2015-05-02 ENCOUNTER — Ambulatory Visit (INDEPENDENT_AMBULATORY_CARE_PROVIDER_SITE_OTHER): Payer: BLUE CROSS/BLUE SHIELD | Admitting: Family Medicine

## 2015-05-02 VITALS — BP 131/79 | HR 76 | Temp 97.6°F | Ht 61.0 in | Wt 163.6 lb

## 2015-05-02 DIAGNOSIS — J069 Acute upper respiratory infection, unspecified: Secondary | ICD-10-CM

## 2015-05-02 DIAGNOSIS — J0111 Acute recurrent frontal sinusitis: Secondary | ICD-10-CM

## 2015-05-02 MED ORDER — AZITHROMYCIN 250 MG PO TABS: ORAL_TABLET | ORAL | Status: DC

## 2015-05-02 MED ORDER — FLUTICASONE PROPIONATE 50 MCG/ACT NA SUSP
1.0000 | Freq: Every day | NASAL | Status: DC
Start: 2015-05-02 — End: 2016-01-28

## 2015-05-02 MED FILL — AZITHROMYCIN 250 MG TABLET: 250 | 5 days supply | Qty: 6 | Fill #0

## 2015-05-02 MED FILL — FLUTICASONE PROP 50 MCG SPR: 50 | 30 days supply | Qty: 16 | Fill #0

## 2015-05-02 NOTE — Progress Notes (Signed)
   Subjective: CC: Cold symptoms/ sinus infection [id number] Kelsey [name] is a 35 y.o. female presenting to clinic today for same day appointment. PCP: Dorcas Mcmurray, MD Concerns today include:  Patient reports a 2 week history of sinus congestion.  She notes that she has this every year.  Often requires an antibiotic.  Left ear pain that started a couple of days ago.  Endorses a headache that comes and goes.  She notes that symptoms are worsening.  Zicam and Motrin with minimal relief.  No fevers, chills.  No sick contacts. No purulence from sinuses.  Endorses rhinorrhea.  No sore throat.  No cough.  No rashes, vomiting, diarrhea.  Social History Reviewed: non smoker. FamHx and MedHx reviewed.  Please see EMR.  ROS: Per HPI  Objective: Office vital signs reviewed. BP 131/79 mmHg  Pulse 76  Temp(Src) 97.6 F (36.4 C) (Oral)  Ht 5\' 1"  (1.549 m)  Wt 163 lb 9.6 oz (74.208 kg)  BMI 30.93 kg/m2  LMP 04/18/2015 (Exact Date)  Physical Examination:  General: Awake, alert, well nourished, stuffy sounding HEENT: [place]/AT, +TTP to frontal and maxillary sinuses.    Neck: No masses palpated. No lymphadenopathy    Ears: Tympanic membranes intact, dull light reflex, no erythema, mild bulging L>R.    Eyes: PERRLA, EOMI    Nose: nasal turbinates moist, slightly edematous, no purulence seen    Throat: moist mucus membranes, mild erythema of the o/p but no exudates.  Cardio: regular rate and rhythm, S1S2 heard, no murmurs appreciated Pulm: clear to auscultation bilaterally, no wheezes, rhonchi or rales, normal work of breathing on room air.  Assessment/ Plan: 35 y.o. female   1. Acute recurrent frontal sinusitis. Afebrile here. - azithromycin (ZITHROMAX) 250 MG tablet; Take as directed  Dispense: 6 each; Refill: 0 - fluticasone (FLONASE) 50 MCG/ACT nasal spray; Place 1 spray into both nostrils daily.  Dispense: 16 g; Refill: 0 - Would consider referral to ENT if continues to have issues with recurrent  sinusitis.    2. Viral URI - azithromycin (ZITHROMAX) 250 MG tablet; Take as directed  Dispense: 6 each; Refill: 0 - fluticasone (FLONASE) 50 MCG/ACT nasal spray; Place 1 spray into both nostrils daily.  Dispense: 16 g; Refill: 0 - Patient to use nettie pot with distilled water - Cont Tylenol or Motrin as needed for sinus headache - PO Fluids, rest - Return precautions per AVS, which patient opted to read electronically over print out.  Janora Norlander, DO PGY-2, [place]

## 2015-05-02 NOTE — Patient Instructions (Signed)
Sinusitis, Adult  Sinusitis is redness, soreness, and puffiness (inflammation) of the air pockets in the bones of your face (sinuses). The redness, soreness, and puffiness can cause air and mucus to get trapped in your sinuses. This can allow germs to grow and cause an infection.   HOME CARE    Drink enough fluids to keep your pee (urine) clear or pale yellow.   Use a humidifier in your home.   Run a hot shower to create steam in the bathroom. Sit in the bathroom with the door closed. Breathe in the steam 3-4 times a day.   Put a warm, moist washcloth on your face 3-4 times a day, or as told by your doctor.   Use salt water sprays (saline sprays) to wet the thick fluid in your nose. This can help the sinuses drain.   Only take medicine as told by your doctor.  GET HELP RIGHT AWAY IF:    Your pain gets worse.   You have very bad headaches.   You are sick to your stomach (nauseous).   You throw up (vomit).   You are very sleepy (drowsy) all the time.   Your face is puffy (swollen).   Your vision changes.   You have a stiff neck.   You have trouble breathing.  MAKE SURE YOU:    Understand these instructions.   Will watch your condition.   Will get help right away if you are not doing well or get worse.     This information is not intended to replace advice given to you by your health care provider. Make sure you discuss any questions you have with your health care provider.     Document Released: 08/13/2007 Document Revised: 03/17/2014 Document Reviewed: 09/30/2011  Elsevier Interactive Patient Education 2016 [employer]

## 2015-12-24 IMAGING — NM NM HEPATO W/GB/PHARM/[PERSON_NAME]
1 series · 12 of 12 positions shown · non-contrast
Comparison: Ultrasound 11/21/2013

CLINICAL DATA: Recurrent right upper quadrant pain and nausea
worsening over the past 3 months.

EXAM:
NUCLEAR MEDICINE HEPATOBILIARY IMAGING WITH GALLBLADDER EF
TECHNIQUE: Sequential images of the abdomen were obtained [DATE] minutes
following intravenous administration of radiopharmaceutical. After
slow intravenous infusion of 1.5 micrograms Cholecystokinin,
gallbladder ejection fraction was determined.
RADIOPHARMACEUTICALS:  5.0 [employer] Fc-IIm Choletec

[Series 1: hepato · 4.46mm/px · 2 acquisitions, 12 frames shown]
[im 1/2]
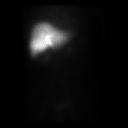
[im 1/2]
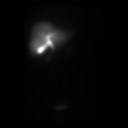
[im 1/2]
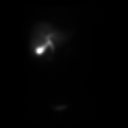
[im 1/2]
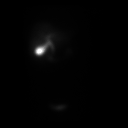
[im 1/2]
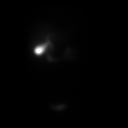
[im 1/2]
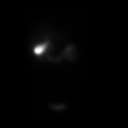
[im 2/2]
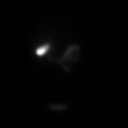
[im 2/2]
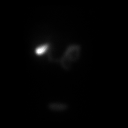
[im 2/2]
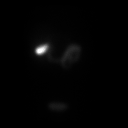
[im 2/2]
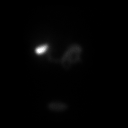
[im 2/2]
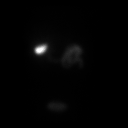
[im 2/2]
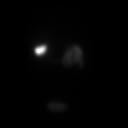

[12 of 12 positions shown; findings below may reference images not displayed]

FINDINGS: Exam demonstrates normal uniform radiotracer uptake by the liver.
Normal gallbladder filling is demonstrated at 15-20 min. Biliary to
bowel transit is present at 30-40 min. At 30 min, the gallbladder
ejection fraction is calculated to be 12.1%.

The patient did experience symptoms during CCK infusion.
IMPRESSION: No evidence of acute cholecystitis or cystic/common bile duct
obstruction.

Decreased gallbladder ejection fraction 12.1% indicating a degree of
chronic gallbladder dysfunction.

## 2016-01-23 ENCOUNTER — Encounter (HOSPITAL_COMMUNITY): Payer: Self-pay

## 2016-01-23 ENCOUNTER — Emergency Department (HOSPITAL_COMMUNITY): Payer: 59

## 2016-01-23 ENCOUNTER — Emergency Department (HOSPITAL_COMMUNITY)
Admission: EM | Admit: 2016-01-23 | Discharge: 2016-01-23 | Disposition: A | Discharge status: Home / Self Care | Payer: 59 | Attending: Emergency Medicine | Admitting: Emergency Medicine

## 2016-01-23 DIAGNOSIS — S8391XA Sprain of unspecified site of right knee, initial encounter: Secondary | ICD-10-CM

## 2016-01-23 DIAGNOSIS — S83014A Lateral dislocation of right patella, initial encounter: Secondary | ICD-10-CM | POA: Diagnosis not present

## 2016-01-23 DIAGNOSIS — M25561 Pain in right knee: Secondary | ICD-10-CM

## 2016-01-23 DIAGNOSIS — S83004A Unspecified dislocation of right patella, initial encounter: Secondary | ICD-10-CM | POA: Diagnosis not present

## 2016-01-23 DIAGNOSIS — X501XXA Overexertion from prolonged static or awkward postures, initial encounter: Secondary | ICD-10-CM | POA: Insufficient documentation

## 2016-01-23 DIAGNOSIS — Y929 Unspecified place or not applicable: Secondary | ICD-10-CM | POA: Diagnosis not present

## 2016-01-23 DIAGNOSIS — S8991XA Unspecified injury of right lower leg, initial encounter: Secondary | ICD-10-CM | POA: Diagnosis present

## 2016-01-23 DIAGNOSIS — Y9389 Activity, other specified: Secondary | ICD-10-CM | POA: Diagnosis not present

## 2016-01-23 DIAGNOSIS — Y990 Civilian activity done for income or pay: Secondary | ICD-10-CM | POA: Insufficient documentation

## 2016-01-23 MED ORDER — MORPHINE SULFATE (PF) 4 MG/ML IV SOLN
4.0000 mg | Freq: Once | INTRAVENOUS | Status: AC
  Administered 2016-01-23: 4 mg via INTRAVENOUS
  Filled 2016-01-23: qty 1

## 2016-01-23 MED ORDER — HYDROCODONE-ACETAMINOPHEN 5-325 MG PO TABS: 1.0000 | ORAL_TABLET | Freq: Four times a day (QID) | ORAL | 0 refills | Status: DC | PRN

## 2016-01-23 MED ORDER — NAPROXEN 500 MG PO TABS: 500.0000 mg | ORAL_TABLET | Freq: Two times a day (BID) | ORAL | 0 refills | Status: DC | PRN

## 2016-01-23 MED ORDER — HYDROCODONE-ACETAMINOPHEN 5-325 MG PO TABS
1.0000 | ORAL_TABLET | Freq: Four times a day (QID) | ORAL | 0 refills | Status: DC | PRN
Start: 2016-01-23 — End: 2016-01-23

## 2016-01-23 MED ORDER — HYDROMORPHONE HCL 2 MG/ML IJ SOLN
1.0000 mg | Freq: Once | INTRAMUSCULAR | Status: AC
  Administered 2016-01-23: 1 mg via INTRAVENOUS
  Filled 2016-01-23: qty 1

## 2016-01-23 MED ORDER — PROMETHAZINE HCL 25 MG/ML IJ SOLN
25.0000 mg | Freq: Once | INTRAMUSCULAR | Status: AC
  Administered 2016-01-23: 25 mg via INTRAVENOUS
  Filled 2016-01-23: qty 1

## 2016-01-23 NOTE — ED Provider Notes (Signed)
[facility] DEPT Provider Note   CSN: [id number] Arrival date & time: 01/23/16  1414     History   Chief Complaint Chief Complaint  Patient presents with  . Knee Injury    HPI Kelsey [name] is a 35 y.o. female with a PMHx of chronic neck pain and headaches, who presents to the ED with complaints of right knee injury approximately 1 hour prior to arrival. Patient states that she was standing at her desk at work and turned with her right foot still planted, causing her knee to twist and suddenly have very severe pain. Per EMS, patient has a deformity/dislocation of the right kneecap. She describes the pain is 9/10 constant sharp nonradiating right knee pain worse with movement and mildly improved with 250 g of fentanyl. She states that after the fentanyl was given she became very nauseated, was given Zofran which did not help, but upon arrival she was given Phenergan and that has helped significantly. Associated symptoms include some mild swelling and decreased range of motion of the knee due to pain, in addition to the deformity. 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 She recalls that she had to have some "fluid drawn off" after that, but has never had any issues since then, and has no orthopedic doctor currently. Her last meal was at 1 PM when she had half a [place] sandwich. She has no medical problems and takes no medications on a regular basis. Only allergic to Sulfa drugs.   She denies any head injury or LOC, abrasions, bruising, chest pain, shortness breath, abdominal pain, vomiting, neck or back pain, numbness, tingling, focal weakness distal to the injury, or any other complaints at this time.   The history is provided by the patient and medical records. No language interpreter was used.    Knee Pain   This is a new problem. The current episode started less than 1 hour ago. The problem occurs constantly. The problem has not changed since onset.The pain is present in the right knee. The quality of the pain is described as sharp. The pain is at a severity of 9/10. The pain is severe. Associated symptoms include limited range of motion. Pertinent negatives include no numbness and no tingling. The symptoms are aggravated by activity. Treatments tried: 239mcg Fentanyl. The treatment provided mild relief. There has been a history of trauma.    Past Medical History:  Diagnosis Date  . Headache(784.0)   . PONV (postoperative nausea and vomiting)   . Pregnancy induced hypertension     Patient Active Problem List   Diagnosis Date Noted  . LUQ abdominal pain 08/15/2014  . Acute pharyngitis 05/01/2014  . Colicky RUQ abdominal pain 11/18/2013  . H/O one miscarriage 11/20/2011  . Cervical radiculopathy at C6 11/20/2011  . ALLERGIC RHINITIS, SEASONAL 12/11/2008    Past Surgical History:  Procedure Laterality Date  . CESAREAN SECTION  07/24/2006   2013  . CHOLECYSTECTOMY  12/2013  . DILATION AND EVACUATION  10/04/2010   Procedure: DILATATION AND EVACUATION (D&E);  Surgeon: Marylynn Pearson;  Location: [facility];  Service: Gynecology;  Laterality: N/A;  pt last ate at 8:30am  . WISDOM TOOTH EXTRACTION  2006    OB History    Gravida Para Term Preterm AB Living  3 2 2   1 2    SAB TAB Ectopic Multiple Live Births   1       1       Home Medications    Prior to Admission medications   Medication Sig Start Date End Date Taking? Authorizing Provider  azithromycin (ZITHROMAX) 250 MG tablet Take as directed 05/02/15   Janora Norlander, DO  cholestyramine [facility]cre[facility] Starch) 4 GM/DOSE powder Take 1 packet (4 g total) by mouth daily before breakfast. 12/05/14   Milus Banister, MD  fluticasone [facility]) 50 MCG/ACT nasal spray Place 1 spray into both nostrils daily. 05/02/15   Janora Norlander,  DO    Family History History reviewed. No pertinent family history.  Social History Social History  Substance Use Topics  . Smoking status: Never Smoker  . Smokeless tobacco: Never Used  . Alcohol use No     Allergies   Sulfonamide derivatives   Review of Systems Review of Systems  HENT: Negative for facial swelling (no head inj).   Respiratory: Negative for shortness of breath.   Cardiovascular: Negative for chest pain.  Gastrointestinal: Positive for nausea (after fentanyl). Negative for abdominal pain and vomiting.  Genitourinary: Negative for difficulty urinating (no incontinence).  Musculoskeletal: Positive for arthralgias (R knee) and joint swelling. Negative for back pain and neck pain.  Skin: Negative for color change and wound.  Allergic/Immunologic: Negative for immunocompromised state.  Neurological: Negative for tingling, syncope, weakness and numbness.  Psychiatric/Behavioral: Negative for confusion.   10 Systems reviewed and are negative for acute change except as noted in the HPI.   Physical Exam Updated Vital Signs BP 141/83 (BP Location: Left Arm)   Pulse 96   Temp 98.5 F (36.9 C) (Oral)   Resp 24   SpO2 100%   Physical Exam  Constitutional: She is oriented to person, place, and time. Vital signs are normal. She appears well-developed and well-nourished.  Non-toxic appearance. No distress.  Afebrile, nontoxic, NAD unless knee is touched or moved at which time pt screams in pain  HENT:  Head: Normocephalic and atraumatic.  Mouth/Throat: Oropharynx is clear and moist and mucous membranes are normal.  Eyes: Conjunctivae and EOM are normal. Right eye exhibits no discharge. Left eye exhibits no discharge.  Neck: Normal range of motion. Neck supple. No spinous process tenderness and no muscular tenderness present. No neck rigidity. Normal range of motion present.  FROM intact without spinous process TTP, no bony stepoffs or deformities, no paraspinous  muscle TTP or muscle spasms. No rigidity or meningeal signs. No bruising or swelling.   Cardiovascular: Normal rate, regular rhythm, normal heart sounds and intact distal pulses.  Exam reveals no gallop and no friction rub.   No murmur heard. Pulmonary/Chest: Effort normal and breath sounds normal. No respiratory distress. She has no decreased breath sounds. She has no wheezes. She has no rhonchi. She has no rales.  Abdominal: Soft. Normal appearance and bowel sounds are normal. She exhibits no distension. There is no tenderness. There is no rigidity, no rebound, no guarding, no CVA tenderness, no tenderness at McBurney's point and negative Murphy's sign.  Musculoskeletal:       Right knee: She exhibits decreased range of motion (due to pain), swelling, deformity and abnormal patellar mobility. She exhibits no ecchymosis, no laceration and no erythema. Tenderness found. Medial joint line and lateral joint line tenderness noted.  R knee with severely limited ROM due to pain, patella noted to be laterally displaced, significant diffuse joint  line TTP but no tenderness to tib/fib or remainder of lower extremity. Pt unable to tolerate full knee exam due to pain. +Mild swelling. No definite crepitus felt, although exam limited due to pain. No bruising or erythema, no warmth. Patellar mobility limited given displacement. Unable to assess for varus/valgus laxity or perform anterior drawer test. Dorsiflexion/plantarflexion grossly intact, sensation grossly intact, distal pulses intact, compartments soft  C-spine as above, all other spinal levels nonTTP without bony stepoffs or deformities  No pelvic instability  Neurological: She is alert and oriented to person, place, and time. She has normal strength. No sensory deficit.  Skin: Skin is warm, dry and intact. No abrasion, no bruising and no rash noted.  No abrasions or bruising over exposed surfaces  Psychiatric: She has a normal mood and affect.  Nursing  note and vitals reviewed.    ED Treatments / Results  Labs (all labs ordered are listed, but only abnormal results are displayed) Labs Reviewed - No data to display  EKG  EKG Interpretation None       Radiology Dg Knee Complete 4 Views Right  Result Date: 01/23/2016 CLINICAL DATA:  Patellar dislocation today. EXAM: RIGHT KNEE - COMPLETE 4+ VIEW COMPARISON:  None. FINDINGS: No evidence of fracture, dislocation, or joint effusion. No evidence of arthropathy or other focal bone abnormality. Soft tissues are unremarkable. IMPRESSION: Negative. Electronically Signed   By: Kathreen Devoid   On: 01/23/2016 16:24    Procedures Reduction of dislocation Date/Time: 01/23/2016 3:45 PM Performed by: Zacarias Pontes Authorized by: Zacarias Pontes  Consent: Verbal consent obtained. Risks and benefits: risks, benefits and alternatives were discussed Consent given by: patient Patient understanding: patient states understanding of the procedure being performed Patient consent: the patient's understanding of the procedure matches consent given Procedure consent: procedure consent matches procedure scheduled Patient identity confirmed: arm band Local anesthesia used: no  Anesthesia: Local anesthesia used: no  Sedation: Patient sedated: no Patient tolerance: Patient tolerated the procedure well with no immediate complications    SPLINT APPLICATION Date/Time: [id number] PM Authorized by: Corine Shelter Consent: Verbal consent obtained. Risks and benefits: risks, benefits and alternatives were discussed Consent given by: patient Splint applied by: Zacarias Pontes and orthopedic technician Location details: R knee Splint type: ace wrap and knee immobilizer Supplies used: ace wrap and knee immobilizer Post-procedure: The splinted body part was neurovascularly unchanged following the procedure. Patient tolerance: Patient tolerated the procedure well with  no immediate complications.     (including critical care time)  Medications Ordered in ED Medications  promethazine (PHENERGAN) injection 25 mg (25 mg Intravenous Given 01/23/16 1420)  morphine 4 MG/ML injection 4 mg (4 mg Intravenous Given 01/23/16 1504)  morphine 4 MG/ML injection 4 mg (4 mg Intravenous Given 01/23/16 1522)  HYDROmorphone (DILAUDID) injection 1 mg (1 mg Intravenous Given 01/23/16 1541)     Initial Impression / Assessment and Plan / ED Course  I have reviewed the triage vital signs and the nursing notes.  Pertinent labs & imaging results that were available during my care of the patient were reviewed by me and considered in my medical decision making (see chart for details).  Clinical Course     35 y.o. female here with obviously dislocated patella, NVI with soft compartments, no focal TTP to tib/fib area, moderate TTP to patella and knee although pt unable to tolerate much of an exam, with lateral displacement of patella, mild swelling of knee, no bruising or skin wounds/changes. Dorsiflexion/plantarflexion still intact. ROM of  knee limited due to pain. Will attempt to get pain controlled and see if we can quickly reduce it without conscious sedation; if we need to proceed with sedation then we will. Given fentanyl en route but wearing off, and caused nausea, zofran didn't help, phenergan given on arrival and that helped significantly. Will hold off on xray until after we attempt reduction, or at least until pain is improved. Will reassess shortly  3:31 PM Pain still not under control after 8mg  morphine, will give 1mg  dilaudid and then attempt reduction. Ace wrap to be placed at bedside. Will reassess shortly.   3:49 PM Reduction successful, applied ace wrap to knee for compression, NVI after reduction and ace wrap application. Will get knee immobilizer and crutches, will obtain xray. Will reassess after xray. Likely home with crutches, immobilizer, and pain meds.    4:28 PM Xray negative, therefore successful reduction of patellar dislocation confirmed given that no dislocation seen any longer. Pt to f/up with ortho in 5-7 days for recheck and ongoing management of injury, crutches and immobilizer in place, discussed RICE, pain med use. I explained the diagnosis and have given explicit precautions to return to the ER including for any other new or worsening symptoms. The patient understands and accepts the medical plan as it's been dictated and I have answered their questions. Discharge instructions concerning home care and prescriptions have been given. The patient is STABLE and is discharged to home in good condition.   Final Clinical Impressions(s) / ED Diagnoses   Final diagnoses:  Dislocation of right patella, initial encounter  Sprain of right knee, unspecified ligament, initial encounter  Acute pain of right knee    New Prescriptions New Prescriptions   HYDROCODONE-ACETAMINOPHEN (NORCO) 5-325 MG TABLET    Take 1 tablet by mouth every 6 (six) hours as needed for severe pain.   NAPROXEN (NAPROSYN) 500 MG TABLET    Take 1 tablet (500 mg total) by mouth 2 (two) times daily as needed for mild pain or moderate pain (TAKE WITH MEALS.).     Navayah Sok Camprubi-Soms, PA-C 01/23/16 1629    Gareth Morgan, MD 01/24/16 1005

## 2016-01-23 NOTE — ED Notes (Signed)
Ice applied to right knee.  

## 2016-01-23 NOTE — ED Notes (Signed)
Patient transported to X-ray 

## 2016-01-23 NOTE — Discharge Instructions (Signed)
Wear knee immobilizer at all times for at least 2 weeks for stabilization of knee. Use crutches for all weight bearing activities. Ice and elevate knee throughout the day, using ice pack for no more than 20 minutes every hour. Alternate between naprosyn and norco for pain relief. Do not drive or operate machinery with pain medication use. Call orthopedic follow up today or tomorrow to schedule followup appointment for in 5-7 days for ongoing management of your knee injury. Return to the ER for changes or worsening symptoms.

## 2016-01-23 NOTE — ED Triage Notes (Signed)
Pt presents to the ed with ems after turning to her side and having excruciating pain in her knee, she has a deformity to her right knee, cms is intact distal to injury.  The patient received 243mcg of fentanyl in route with ems and 4 of zofran, she is actively vomiting and reports feeling very nauseous.  VO from edp to give IV phenergan 25 mg. Patient is alert and oriented.

## 2016-01-28 ENCOUNTER — Encounter: Payer: Self-pay | Admitting: Sports Medicine

## 2016-01-28 ENCOUNTER — Ambulatory Visit (INDEPENDENT_AMBULATORY_CARE_PROVIDER_SITE_OTHER): Payer: 59 | Admitting: Sports Medicine

## 2016-01-28 DIAGNOSIS — S83004S Unspecified dislocation of right patella, sequela: Secondary | ICD-10-CM

## 2016-01-28 DIAGNOSIS — S83004A Unspecified dislocation of right patella, initial encounter: Secondary | ICD-10-CM | POA: Diagnosis not present

## 2016-01-28 HISTORY — DX: Unspecified dislocation of right patella, sequela: S83.004S

## 2016-01-28 NOTE — Assessment & Plan Note (Signed)
Convert from knee immobilizer to a knee brace with medial and lateral support Touchdown walking with crutches for 2 more days Wean crutches when possible  Begin straight leg raises Begin gentle motion  Recheck in 2 weeks

## 2016-01-28 NOTE — Progress Notes (Signed)
Followup -- right patellar dislocation  Patient seen on Wednesday in the emergency room for a complete patellar dislocation Reduction was successful She has been in an immobilizer since Wednesday She has iced daily for the past 5 days Knee pain is minimal She has been taking Naprosyn She did not take any hydrocodone  Past history She dislocated the same patella about 20 years ago This is her first recurrence  Review of system Pain in low back with walking on crutches No locking or giving way in the past 5 days  Physical examination Pleasant female in no acute distress BP (!) 153/80   Pulse 83   Ht 5\' 1"  (1.549 m)   Wt 160 lb (72.6 kg)   BMI 30.23 kg/m   Right knee Minimal swelling is present at this time Ligaments seem stable Palpation of the patella reveals no discrete areas of tenderness She will do some gentle flexion to about 15-20  Ultrasound No effusion is currently seen in the suprapatellar pouch There is some swelling along the lateral joint capsule and lateral meniscus No swelling along the medial meniscus Quadriceps and patellar tendons look normal Patella looks normal without any signs of fracture

## 2016-01-28 NOTE — Patient Instructions (Signed)
Try new bracing Use crutches for first 2 days, with toe touch down Then if stable, walk without crutches. May use a cane or walking stick for balance Start straight leg raises Start mini knee bends Keep up the icing Stand on left leg, and do forward and backward swings and circles with the right leg in the brace  Follow up in 2 weeks

## 2016-02-12 ENCOUNTER — Encounter: Payer: Self-pay | Admitting: Sports Medicine

## 2016-02-12 ENCOUNTER — Ambulatory Visit (INDEPENDENT_AMBULATORY_CARE_PROVIDER_SITE_OTHER): Payer: 59 | Admitting: Sports Medicine

## 2016-02-12 DIAGNOSIS — S83004S Unspecified dislocation of right patella, sequela: Secondary | ICD-10-CM | POA: Diagnosis not present

## 2016-02-12 NOTE — Patient Instructions (Signed)
Start your rehab by isometrics and easy motion  Seated knee extension and flexion Squeeze ball between knees When you get full extension add a 2 lb ankle weight Goal is 3 sets of 15  Wall sits - try 30 secs x 3 As this gets better increase the depth  Mini squats on 1 leg Hold for balance Do 10 easy mini-squats Repeat up to 3 sets as gets easy  Stationary or recumbent bike  For the next 3 weeks use the brace for standing and walking a decent distance  Repeat exam in 4 weeks  Ice in evenings if helpful

## 2016-02-12 NOTE — Assessment & Plan Note (Signed)
She has improved  We will progress HEP  After completion of rehab begin an exercise program with focus on strength and weight loss  Bracing for activity at least 3 more weeks  Reck 4 weeks

## 2016-02-12 NOTE — Progress Notes (Signed)
RT patellar dislocation F/U  Now 3 wks post Dislx No swelling Not needing pain meds Uses brace for walking and this helps Ices twice a day Has been doing static exercises and quad sets, easy motion  Would like to advance her rehab  Family Hx : 2 children 8 and 4 Lives with husband  ROS No locking No giving way  Pexam  Gen in NAD BP 118/77   Pulse 81   Ht 5\' 1"  (1.549 m)   Wt 160 lb (72.6 kg)   BMI 30.23 kg/m   RT Knee Knee: Normal to inspection with no erythema or effusion or obvious bony abnormalities. Palpation normal with no warmth or joint line tenderness or patellar tenderness or condyle tenderness. ROM -flexion to 145 deg then tight/  extension to - 10 deg Normal lower leg rotation. Ligaments with solid consistent endpoints including ACL, PCL, LCL, MCL. Negative Mcmurray's and provocative meniscal tests. Hip abduction is strong Hamstring and quadriceps strength is normal.  Able to walk with slight limp Able to do a wall sit

## 2016-02-20 ENCOUNTER — Encounter: Payer: Self-pay | Admitting: Sports Medicine

## 2016-02-20 ENCOUNTER — Ambulatory Visit (INDEPENDENT_AMBULATORY_CARE_PROVIDER_SITE_OTHER): Payer: 59 | Admitting: Sports Medicine

## 2016-02-20 DIAGNOSIS — S83004S Unspecified dislocation of right patella, sequela: Secondary | ICD-10-CM

## 2016-02-20 DIAGNOSIS — M25562 Pain in left knee: Secondary | ICD-10-CM | POA: Insufficient documentation

## 2016-02-20 HISTORY — DX: Pain in left knee: M25.562

## 2016-02-20 NOTE — Progress Notes (Signed)
   CC: Left knee pain  4 weeks ago RT patellar dislocation This knee continues to improve She has much better motion and strength She is only using the brace when she is doing a lot  While doing the stationary bicycle and exercises for right knee rehabilitation she did these with the left knee Now the left is popping on the outer edge Not much pain Worried about dislocation so came for evaluation  Review of systems No locking No giving way No significant swelling  Left Knee: Normal to inspection with no erythema or effusion or obvious bony abnormalities. Palpation normal with no warmth or joint line tenderness or patellar tenderness or condyle tenderness. ROM normal in flexion and extension and lower leg rotation. Ligaments with solid consistent endpoints including ACL, PCL, LCL, MCL. Negative Mcmurray's and provocative meniscal tests. Non painful patellar compression. The only persistent finding is lateral tracking and clicking of patella Patellar and quadriceps tendons unremarkable. Hamstring and quadriceps strength is normal.  RIght knee  Still with some mild peripatellar swelling Much less tenderness She is able to flex to about 140 and is now getting full extension  Walking she is still favoring the right knee / not getting a normal amount of flexion

## 2016-02-20 NOTE — Assessment & Plan Note (Signed)
I think the rehabilitation exercises were too aggressive for her left knee  We will modify these that keep them up  Continue some of the focused exercises for the the VMO

## 2016-02-20 NOTE — Assessment & Plan Note (Signed)
This is progressing well  We modified her rehabilitation program to stay at a lower weight resistance  I think she was increasing the exercise a bit too fast  Recheck again in one month

## 2016-03-11 ENCOUNTER — Encounter: Payer: Self-pay | Admitting: Sports Medicine

## 2016-03-11 ENCOUNTER — Ambulatory Visit (INDEPENDENT_AMBULATORY_CARE_PROVIDER_SITE_OTHER): Payer: 59 | Admitting: Sports Medicine

## 2016-03-11 DIAGNOSIS — S83004S Unspecified dislocation of right patella, sequela: Secondary | ICD-10-CM | POA: Diagnosis not present

## 2016-03-11 NOTE — Progress Notes (Signed)
    Subjective:  Kelsey [name] is a 36 y.o. female who presents to the [facility] today with a chief complaint of knee dislocation follow up  HPI:  Knee dislocation Patient with a history of a right patellar dislocation approximately 7 weeks ago. About 3 weeks ago she returned to clinic with left knee pain and concern for clicking and popping. She was instructed to modify her exercises and switch to a lower resistance on her exercise bike. Since then, her left knee has improved significantly and she has not noticed any further issues with pain or clicking/popping. Her right knee is improving also, but she still has a slight limp when she walks and occasionally feels like catching sensation.   ROS: No knee swelling, no giving way, no locking otherwise per HPI  Objective:  Physical Exam: BP 122/67   Pulse 77   Ht 5\' 1"  (1.549 m)   Wt 160 lb (72.6 kg)   BMI 30.23 kg/m   Gen: NAD, resting comfortably Left Knee: No deformities. Nontender to palpation. No effusions. Extends to about 5 degrees, full flexion. Right knee: No deformities. Nontender to palpation.No effusions. Extends to about 10 degrees, full flexion.  Lachman's negative. Apprehension with slight pressure to patella Gait: Walks with limp for the first 3-4 steps, then normalizes.   Bedside US: Right knee without effusion. Medial and lateral menisci normal. Small spur noted at lateral trochlear groove with minimal surrounding effusion.   Assessment/Plan:  Closed patellar dislocation, right, sequela Improving. Continue stationary bike and home exercises with gradually increasing resistance as tolerated. Use knee brace as needed. Follow up in 4-6 weeks.   Algis Greenhouse. Jerline Pain, [place] Resident PGY-3 03/11/2016 10:20 AM   I observed and examined the patient with the resident and agree with assessment and plan.  Note reviewed and modified by me. Stefanie Libel, MD

## 2016-03-11 NOTE — Assessment & Plan Note (Addendum)
Improving. Continue stationary bike and home exercises with gradually increasing resistance as tolerated. Use knee brace as needed. Follow up in 4-6 weeks.

## 2016-03-12 ENCOUNTER — Ambulatory Visit: Payer: 59 | Admitting: Sports Medicine

## 2016-03-17 ENCOUNTER — Other Ambulatory Visit: Payer: Self-pay | Admitting: Family Medicine

## 2016-03-17 ENCOUNTER — Telehealth: Payer: Self-pay | Admitting: *Deleted

## 2016-03-17 DIAGNOSIS — F40243 Fear of flying: Secondary | ICD-10-CM

## 2016-03-17 MED ORDER — DIAZEPAM 5 MG PO TABS: ORAL_TABLET | ORAL | 0 refills | Status: DC

## 2016-03-17 MED FILL — diazePAM 5 MG TABS: 5 | 5 days supply | Qty: 20 | Fill #0

## 2016-03-17 NOTE — Telephone Encounter (Signed)
Rx phoned in per Dr. Nori Riis. Katharina Caper, Terralyn Matsumura D, CMA

## 2016-03-17 NOTE — Progress Notes (Signed)
Preparing for trip to [place] long flights plus helicopter ride3. Has used valium in past succsfully for flight anxiety Will fill.

## 2016-04-18 ENCOUNTER — Other Ambulatory Visit: Payer: Self-pay | Admitting: *Deleted

## 2016-04-18 DIAGNOSIS — S83004S Unspecified dislocation of right patella, sequela: Secondary | ICD-10-CM

## 2016-04-22 ENCOUNTER — Encounter: Payer: Self-pay | Admitting: Sports Medicine

## 2016-04-22 ENCOUNTER — Ambulatory Visit (INDEPENDENT_AMBULATORY_CARE_PROVIDER_SITE_OTHER): Payer: 59 | Admitting: Sports Medicine

## 2016-04-22 DIAGNOSIS — S83004S Unspecified dislocation of right patella, sequela: Secondary | ICD-10-CM | POA: Diagnosis not present

## 2016-04-22 NOTE — Progress Notes (Signed)
  Kelsey [name] - 36 y.o. female MRN [id number]  Date of birth: [birth year]  SUBJECTIVE:  Including CC & ROS.   Ms. Kelsey [name] is a 36 yo F that for follow a history of right patellar dislocation about 12 weeks ago. She reports that she is having medial patellar pain now. She is also still walking with a limp. The pain is also worse at the end of the day. She's been doing her exercises and running the right coming bike without much improvement of her leg extension. She has not been wearing a brace. She is also not been taking any medications. She has not received an injection in her knee. The pain is worse after walking at the end of the day. The pain is also worse with walking downstairs.  ROS: No unexpected weight loss, fever, chills, swelling, numbness/tingling, redness, otherwise see HPI    HISTORY: Past Medical, Surgical, Social, and Family History Reviewed & Updated per EMR.   Pertinent Historical Findings include: PMSHx -  right patellar dislocation  DATA REVIEWED: 01/23/16: right knee x-ray: normal   PHYSICAL EXAM:  VS: BP:123/77  HR: bpm  TEMP: ( )  RESP:   HT:5\' 1"  (154.9 cm)   WT:160 lb (72.6 kg)  BMI:30.3 PHYSICAL EXAM: Gen: NAD, alert, cooperative with exam, well-appearing HEENT: clear conjunctiva, EOMI CV:  no edema, capillary refill brisk,  Resp: non-labored, normal speech Skin: no rashes, normal turgor  Neuro: no gross deficits.  Psych:  alert and oriented Right knee:  Tenderness to palpation over the medial aspect of the patella. Tenderness to palpation over the medial joint line. No tenderness to palpation over the lateral joint line. Unable to completely extend. Can extend 10. Able to achieve full extension if lying flat. Flexion it to about 130 before pain starts. Pain with patellar compression as well as patellar tilt Walking with slight limp.   ASSESSMENT & PLAN:   Closed patellar dislocation, right, sequela Having some restrictions with her range of motion  but also having some pain with walking. Appears that she may be developing some scar tissue that slowed inhibiting her from achieving full extension. - Referral had been placed to physical therapy. - Try a body helix for her knee. - Follow-up in 4 weeks.

## 2016-04-22 NOTE — Assessment & Plan Note (Signed)
Having some restrictions with her range of motion but also having some pain with walking. Appears that she may be developing some scar tissue that slowed inhibiting her from achieving full extension. - Referral had been placed to physical therapy. - Try a body helix for her knee. - Follow-up in 4 weeks.

## 2016-05-05 DIAGNOSIS — S83001D Unspecified subluxation of right patella, subsequent encounter: Secondary | ICD-10-CM | POA: Diagnosis not present

## 2016-05-05 DIAGNOSIS — M25561 Pain in right knee: Secondary | ICD-10-CM | POA: Diagnosis not present

## 2016-05-05 DIAGNOSIS — S8391XD Sprain of unspecified site of right knee, subsequent encounter: Secondary | ICD-10-CM | POA: Diagnosis not present

## 2016-05-12 DIAGNOSIS — M25561 Pain in right knee: Secondary | ICD-10-CM | POA: Diagnosis not present

## 2016-05-12 DIAGNOSIS — S83001D Unspecified subluxation of right patella, subsequent encounter: Secondary | ICD-10-CM | POA: Diagnosis not present

## 2016-05-12 DIAGNOSIS — S8391XD Sprain of unspecified site of right knee, subsequent encounter: Secondary | ICD-10-CM | POA: Diagnosis not present

## 2016-05-15 DIAGNOSIS — M25561 Pain in right knee: Secondary | ICD-10-CM | POA: Diagnosis not present

## 2016-05-15 DIAGNOSIS — S8391XD Sprain of unspecified site of right knee, subsequent encounter: Secondary | ICD-10-CM | POA: Diagnosis not present

## 2016-05-15 DIAGNOSIS — S83001D Unspecified subluxation of right patella, subsequent encounter: Secondary | ICD-10-CM | POA: Diagnosis not present

## 2016-05-26 DIAGNOSIS — M25561 Pain in right knee: Secondary | ICD-10-CM | POA: Diagnosis not present

## 2016-05-26 DIAGNOSIS — S8391XD Sprain of unspecified site of right knee, subsequent encounter: Secondary | ICD-10-CM | POA: Diagnosis not present

## 2016-05-26 DIAGNOSIS — S83001D Unspecified subluxation of right patella, subsequent encounter: Secondary | ICD-10-CM | POA: Diagnosis not present

## 2016-05-29 DIAGNOSIS — S83001D Unspecified subluxation of right patella, subsequent encounter: Secondary | ICD-10-CM | POA: Diagnosis not present

## 2016-05-29 DIAGNOSIS — S8391XD Sprain of unspecified site of right knee, subsequent encounter: Secondary | ICD-10-CM | POA: Diagnosis not present

## 2016-05-29 DIAGNOSIS — M25561 Pain in right knee: Secondary | ICD-10-CM | POA: Diagnosis not present

## 2016-06-02 DIAGNOSIS — M25561 Pain in right knee: Secondary | ICD-10-CM | POA: Diagnosis not present

## 2016-06-02 DIAGNOSIS — S8391XD Sprain of unspecified site of right knee, subsequent encounter: Secondary | ICD-10-CM | POA: Diagnosis not present

## 2016-06-02 DIAGNOSIS — S83001D Unspecified subluxation of right patella, subsequent encounter: Secondary | ICD-10-CM | POA: Diagnosis not present

## 2016-06-09 DIAGNOSIS — S83001D Unspecified subluxation of right patella, subsequent encounter: Secondary | ICD-10-CM | POA: Diagnosis not present

## 2016-06-09 DIAGNOSIS — S8391XD Sprain of unspecified site of right knee, subsequent encounter: Secondary | ICD-10-CM | POA: Diagnosis not present

## 2016-06-09 DIAGNOSIS — M25561 Pain in right knee: Secondary | ICD-10-CM | POA: Diagnosis not present

## 2016-06-16 DIAGNOSIS — S8391XD Sprain of unspecified site of right knee, subsequent encounter: Secondary | ICD-10-CM | POA: Diagnosis not present

## 2016-06-16 DIAGNOSIS — S83001D Unspecified subluxation of right patella, subsequent encounter: Secondary | ICD-10-CM | POA: Diagnosis not present

## 2016-06-16 DIAGNOSIS — M25561 Pain in right knee: Secondary | ICD-10-CM | POA: Diagnosis not present

## 2016-06-19 DIAGNOSIS — S83001D Unspecified subluxation of right patella, subsequent encounter: Secondary | ICD-10-CM | POA: Diagnosis not present

## 2016-06-19 DIAGNOSIS — M25561 Pain in right knee: Secondary | ICD-10-CM | POA: Diagnosis not present

## 2016-06-19 DIAGNOSIS — S8391XD Sprain of unspecified site of right knee, subsequent encounter: Secondary | ICD-10-CM | POA: Diagnosis not present

## 2016-06-23 DIAGNOSIS — M25561 Pain in right knee: Secondary | ICD-10-CM | POA: Diagnosis not present

## 2016-06-23 DIAGNOSIS — S8391XD Sprain of unspecified site of right knee, subsequent encounter: Secondary | ICD-10-CM | POA: Diagnosis not present

## 2016-06-23 DIAGNOSIS — S83001D Unspecified subluxation of right patella, subsequent encounter: Secondary | ICD-10-CM | POA: Diagnosis not present

## 2016-06-27 DIAGNOSIS — S83001D Unspecified subluxation of right patella, subsequent encounter: Secondary | ICD-10-CM | POA: Diagnosis not present

## 2016-06-27 DIAGNOSIS — S8391XD Sprain of unspecified site of right knee, subsequent encounter: Secondary | ICD-10-CM | POA: Diagnosis not present

## 2016-06-27 DIAGNOSIS — M25561 Pain in right knee: Secondary | ICD-10-CM | POA: Diagnosis not present

## 2016-07-03 DIAGNOSIS — S83001D Unspecified subluxation of right patella, subsequent encounter: Secondary | ICD-10-CM | POA: Diagnosis not present

## 2016-07-03 DIAGNOSIS — M25561 Pain in right knee: Secondary | ICD-10-CM | POA: Diagnosis not present

## 2016-07-03 DIAGNOSIS — S8391XD Sprain of unspecified site of right knee, subsequent encounter: Secondary | ICD-10-CM | POA: Diagnosis not present

## 2016-07-07 DIAGNOSIS — S83001D Unspecified subluxation of right patella, subsequent encounter: Secondary | ICD-10-CM | POA: Diagnosis not present

## 2016-07-07 DIAGNOSIS — M25561 Pain in right knee: Secondary | ICD-10-CM | POA: Diagnosis not present

## 2016-07-07 DIAGNOSIS — S8391XD Sprain of unspecified site of right knee, subsequent encounter: Secondary | ICD-10-CM | POA: Diagnosis not present

## 2016-07-11 DIAGNOSIS — S83001D Unspecified subluxation of right patella, subsequent encounter: Secondary | ICD-10-CM | POA: Diagnosis not present

## 2016-07-11 DIAGNOSIS — M25561 Pain in right knee: Secondary | ICD-10-CM | POA: Diagnosis not present

## 2016-07-11 DIAGNOSIS — S8391XD Sprain of unspecified site of right knee, subsequent encounter: Secondary | ICD-10-CM | POA: Diagnosis not present

## 2016-07-18 DIAGNOSIS — M25561 Pain in right knee: Secondary | ICD-10-CM | POA: Diagnosis not present

## 2016-07-18 DIAGNOSIS — S83001D Unspecified subluxation of right patella, subsequent encounter: Secondary | ICD-10-CM | POA: Diagnosis not present

## 2016-07-18 DIAGNOSIS — S8391XD Sprain of unspecified site of right knee, subsequent encounter: Secondary | ICD-10-CM | POA: Diagnosis not present

## 2016-08-22 ENCOUNTER — Ambulatory Visit (INDEPENDENT_AMBULATORY_CARE_PROVIDER_SITE_OTHER): Payer: 59 | Admitting: Family Medicine

## 2016-08-22 ENCOUNTER — Encounter: Payer: Self-pay | Admitting: Family Medicine

## 2016-08-22 DIAGNOSIS — E669 Obesity, unspecified: Secondary | ICD-10-CM | POA: Diagnosis not present

## 2016-08-22 NOTE — Patient Instructions (Addendum)
1. Eat at least 3 REAL meals and 1-2 snacks per day.  Aim for no more than 5 hours between eating.  Eat breakfast within one hour of getting up.   A REAL meal includes at least some protein, some starch, and vegetables and/or fruit.    If you omit the starch component from any meal, pay attention to your satisfaction from that meal as well as your  appetite following that meal.    For breakfast, get at least a protein food and some fruit.   2. Set aside a few minutes each week to plan meals for the upcoming week.    Make a list of 7-10 dinner meals that taste good, are relatively quick and easy to prepare, and that meet your  nutritional needs.  Use this as a basis for shopping, so you can make one of these meals any time.  Bring your  list to your follow-up appointment for review.   3. When you get clearance to increase exercise intensity, you may want to make some specific goals around this.    - Document your progress using the Goals Sheet provided today.

## 2016-08-22 NOTE — Progress Notes (Signed)
Medical Nutrition Therapy:  Appt start time: 1430 end time:  3825.  Assessment:  Primary concerns today: Weight management.   Kelsey [name] is frustrated that she has not been able to lose weight since her first child >10 yrs ago.  She was preeclampsic with that pregnancy.  [name] sleeps adequately; gets 8 hrs/night.  Her main challenges with respect to weight management include physical activity, especially since she dislocated her knee last November, and in not planning meals enough.  She has had success with Weight [employer] in the past, and is considering starting to use that program again.    Learning Readiness: Change in progress; [name] is currently taking the [employer]: Managing Emotional Eating class.    Usual eating pattern includes 2 meals and 0-1 snack per day.   Frequent foods and beverages include 2 c coffee with creamer/day; 8 oz diet Coke 3-4 X wk; pizza once a week.  Avoided foods include salad as a meal.   Usual physical activity includes 45 min low-intensity stationary bike 7 X day (usually ~6 mi), as well as stretching daily.  24-hr recall: (Up at 5:30 AM) B (6 AM)-   1 c coffee with 3-4 T creamer Snk (9 AM)-   1 c coffee with 3-4 T creamer L ( PM)-  None; worked thru lunch, 20 oz water Snk (2 PM)-  1 mini Kind bar D (6:30 PM)-  <6" chx, mozzarella, marinara sub, 2 T hummus, 5-6 GF crackers Snk ( PM)-  --- Typical day? No. Usually eats lunch, which is takeout from [place] or leftovers from home.  Usually has dessert 1 X wk.  Rarely drinks alcohol.    Progress Towards Goal(s):  In progress.   Nutritional Diagnosis:  [place]-3.3 Overweight/obesity As related to energy balance.  As evidenced by BMI >30.    Intervention:  Nutrition education.    Handouts given during visit include:  AVS  Goals Sheet  Demonstrated degree of understanding via:  Teach Back  Barriers to learning/adherence to lifestyle change: Time constraints.   Monitoring/Evaluation:  Dietary  intake, exercise, and body weight in 6 week(s).

## 2016-08-28 ENCOUNTER — Ambulatory Visit (INDEPENDENT_AMBULATORY_CARE_PROVIDER_SITE_OTHER): Payer: 59 | Admitting: Sports Medicine

## 2016-08-28 DIAGNOSIS — S83004S Unspecified dislocation of right patella, sequela: Secondary | ICD-10-CM

## 2016-08-28 DIAGNOSIS — M7741 Metatarsalgia, right foot: Secondary | ICD-10-CM | POA: Insufficient documentation

## 2016-08-28 DIAGNOSIS — M7742 Metatarsalgia, left foot: Secondary | ICD-10-CM | POA: Diagnosis not present

## 2016-08-28 HISTORY — DX: Metatarsalgia, right foot: M77.41

## 2016-08-28 NOTE — Progress Notes (Signed)
   Subjective:    Patient ID: Kelsey [name], female    DOB: Jun 01, 1980, 36 y.o.   MRN: [id number]  HPI Kelsey [name] is a 36yo with hx of R patellar dislocation in 01/2016, here for follow up of this problem. She has now completed extensive physical therapy which has been very helpful as she is working on regaining her strength and mobility. She denies any pain of the R knee but still has instability of that knee particularly with walking and at the end of the day. She has been riding a bike daily to help strengthen the muscles around the knee which has also helped.   Today she complains of pain in the medial side of the L knee and on the dorsal aspect of both feet, L>R, that have been present for a few weeks. The foot pain is located proximal to her 2nd-4th toes at prox MTs of both feet. She describes the foot pain as sharp and says that her feet feel like they are "on fire" especially with walking.   Past Hx Obesity - but now starting both a fitness program Meeting with Dr Jenne Campus for nutrition counseling C6 radiculpathy has improved  Review of Systems No other joint pain except as described above. No joint swelling, no fevers.    Objective:   Physical Exam BP 130/70   LMP 08/01/2016 (Approximate)   GEN: Well appearing, NAD RESP: Comfortable WOB EXTREM: warm and well perfused SKIN: no rashes  Bilateral knees - no tenderness to palpation over the patella or at joint lines Near complete ROM -5  degrees fully extended, 145 degrees flexed Negative McMurray's, negative anterior drawer test No pain or instability with lateral force at knee   Tracking with flexion appears normal  Bilateral feet with point tenderness to palpation over plantar surface at distal 1/3 of foot in between third and fourth metatarsals.   Gait instability, leans forward while walking and when standing  Antalgic pushoff  standing some loss of long and transverse arch      Assessment & Plan:   1. Patellar  dislocation, R, repeat encounter - Patient has been improving functionally after seeing physical therapy and exercising on the bike. - Continue using stationary bike - Goal is to work towards being comfortable walking - Once pt is able to walk will encourage strengthening and balance exercise of standing on one leg and flexing the knee - Can use ibuprofen as needed for pain or swelling  2. Metatarsalgia - Patient likely having pain in plantar surface of feet from leaning forward with walking and putting extra weight on the anterior portion of the foot. The fact that her L foot is more painful than her R is also consistent with an injury from compensating for her R knee. - Provided sports insole inserts with MT cookies at midffot of the feet to relieve pressure there, to provide pain relief and try to prevent neuroma formation  Post placement of pads she could walk with less pressure  Erin Fulling, MD [facility], PGY1 08/28/16  I observed and examined the patient with the resident and agree with assessment and plan.  Note reviewed and modified by me. Stefanie Libel, MD

## 2016-08-28 NOTE — Assessment & Plan Note (Signed)
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

## 2016-08-28 NOTE — Assessment & Plan Note (Signed)
Trial with sports insoles Addition of broad MT cookie  Triggered by abnormal gait while knee injured

## 2016-10-02 ENCOUNTER — Ambulatory Visit: Payer: 59 | Admitting: Family Medicine

## 2017-01-15 DIAGNOSIS — D225 Melanocytic nevi of trunk: Secondary | ICD-10-CM | POA: Diagnosis not present

## 2017-01-15 DIAGNOSIS — L918 Other hypertrophic disorders of the skin: Secondary | ICD-10-CM | POA: Diagnosis not present

## 2017-01-15 DIAGNOSIS — D2372 Other benign neoplasm of skin of left lower limb, including hip: Secondary | ICD-10-CM | POA: Diagnosis not present

## 2017-01-15 DIAGNOSIS — D1801 Hemangioma of skin and subcutaneous tissue: Secondary | ICD-10-CM | POA: Diagnosis not present

## 2017-01-15 DIAGNOSIS — D2272 Melanocytic nevi of left lower limb, including hip: Secondary | ICD-10-CM | POA: Diagnosis not present

## 2017-01-15 DIAGNOSIS — D2261 Melanocytic nevi of right upper limb, including shoulder: Secondary | ICD-10-CM | POA: Diagnosis not present

## 2017-01-15 DIAGNOSIS — D2262 Melanocytic nevi of left upper limb, including shoulder: Secondary | ICD-10-CM | POA: Diagnosis not present

## 2017-01-15 DIAGNOSIS — R208 Other disturbances of skin sensation: Secondary | ICD-10-CM | POA: Diagnosis not present

## 2017-01-15 DIAGNOSIS — D2271 Melanocytic nevi of right lower limb, including hip: Secondary | ICD-10-CM | POA: Diagnosis not present

## 2017-01-15 DIAGNOSIS — L819 Disorder of pigmentation, unspecified: Secondary | ICD-10-CM | POA: Diagnosis not present

## 2017-08-13 ENCOUNTER — Ambulatory Visit: Payer: 59 | Admitting: Sports Medicine

## 2017-08-13 DIAGNOSIS — M25562 Pain in left knee: Secondary | ICD-10-CM

## 2017-08-14 NOTE — Progress Notes (Signed)
   Subjective:    Patient ID: Kelsey [name], female    DOB: 20-Nov-1980, 37 y.o.   MRN: [id number]  HPI Pt presents with 2 weeks of L knee pain. No hx of prior problems. She reports having dislocated her R knee cap severla times in the past. 2 weeks ago when she was first getting out of bed she felt like her L knee cap was not properly seated and had pain for 5-10 minutes that "ran vertically". She couldn't weightbear with this. She had a second occurrence a week ago and then a third last night and decided she should be checked. This third episode is different because her knee still hurts. No meds taken. It just doesn't feel right. Pain medially now, 4-5/10. 7/10 when it happened. It felt stuck while she was standing. No numbness or tingling.  Past HX Full dislocation of RT patella 1/18 Still has residual weakness from this   Review of Systems As above LEft knee pain in 02/2016 seemed related to abnormal tracking Hx of hyperextension and mobility when young and similar sxs    Objective:   Physical Exam Seated in NAD BP 122/88   NL inspection of L knee, no appreciable swelling L knee is mildly TTP at the superior aspect of the patella, patella is in NL position Some TTP along medial peripatellar area and medial joint line NL ROM Strength 5/5 Knee is stable, negative anterior and posterior drawer signs, no laxity with medial or lateral movement NVI  Ultrasound of Left Knee Slight swelling along medial capsule and peripatellar space Quad and PT intact Meniscus is normal appearance medailly and laterally No effusion but some slight fluid in SPP Trochelar grrove - cartilage preserved  Impression; Capsular sprain         Assessment & Plan:  L knee capsular strain NSAIDs prn pain Ice to help with pain L knee Body Helix compression sleeve Inside leg lift exercises Stationary bike for strengthening F/u in 1 month

## 2017-08-16 ENCOUNTER — Encounter: Payer: Self-pay | Admitting: Sports Medicine

## 2017-08-16 NOTE — Assessment & Plan Note (Signed)
6/19 - recurrence of left knee pain Still appears 2/2 hypermobility and abnormal tracking  Compression sleeve Rehab to build medial and adductor strength  follow

## 2017-08-23 NOTE — Progress Notes (Signed)
Subjective:    Kelsey [name] is a 37 y.o. female and is here for a comprehensive physical exam.  Current Outpatient Medications:  .  NONE  Health Maintenance Due  Topic Date Due  . TETANUS/TDAP  07/22/1997  . PAP SMEAR  08/09/2009   PMHx, SurgHx, SocialHx, Medications, and Allergies were reviewed in the Visit Navigator and updated as appropriate.   Past Medical History:  Diagnosis Date  . Headache(784.0)   . PONV (postoperative nausea and vomiting)   . Pregnancy induced hypertension     Past Surgical History:  Procedure Laterality Date  . CESAREAN SECTION  07/24/2006   2013  . CHOLECYSTECTOMY  12/2013  . DILATION AND EVACUATION  10/04/2010   Procedure: DILATATION AND EVACUATION (D&E);  Surgeon: Marylynn Pearson;  Location: [facility];  Service: Gynecology;  Laterality: N/A;  pt last ate at 8:30am  . WISDOM TOOTH EXTRACTION  2006    Family History  Problem Relation Age of Onset  . Cancer Mother   . Hypertension Father   . Alcohol abuse Father   . Heart disease Father    Social History   Tobacco Use  . Smoking status: Never Smoker  . Smokeless tobacco: Never Used  Substance Use Topics  . Alcohol use: No  . Drug use: No    Review of Systems:   Pertinent items are noted in the HPI. Otherwise, ROS is negative.  Objective:   BP 126/84   Pulse 80   Temp 98.4 F (36.9 C) (Oral)   Ht 5\' 2"  (1.575 m)   Wt 177 lb 3.2 oz (80.4 kg)   LMP 08/03/2017   SpO2 98%   BMI 32.41 kg/m    General appearance: alert, cooperative and appears stated age. Head: normocephalic, without obvious abnormality, atraumatic. Neck: no adenopathy, supple, symmetrical, trachea midline; thyroid not enlarged, symmetric, no tenderness/mass/nodules. Lungs: clear to auscultation bilaterally. Heart: regular rate and rhythm Abdomen: soft, non-tender; no masses,  no organomegaly. Extremities: extremities normal, atraumatic, no cyanosis or edema. Skin: skin color, texture, turgor normal, no  rashes or lesions. Lymph: cervical, supraclavicular, and axillary nodes normal; no abnormal inguinal nodes palpated. Neurologic: grossly normal.  Assessment/Plan:   Kelsey [name] was seen today for establish care.  Diagnoses and all orders for this visit:  Routine physical examination  Weight gain -     phentermine (ADIPEX-P) 37.5 MG tablet; Take 1 tablet (37.5 mg total) by mouth daily before breakfast. -     CBC with Differential/Platelet -     Comprehensive metabolic panel -     TSH -     Vitamin B12 -     Hemoglobin A1c  Screening for lipid disorders -     Lipid panel  Vitamin D deficiency -     VITAMIN D 25 Hydroxy (Vit-D Deficiency, Fractures)  Fatigue, unspecified type -     CBC with Differential/Platelet -     Comprehensive metabolic panel -     TSH -     Vitamin B12  Psychophysiological insomnia -     traZODone (DESYREL) 50 MG tablet; Take 0.5-1 tablets (25-50 mg total) by mouth at bedtime as needed for sleep.   Patient Counseling:   [x]     Nutrition: Stressed importance of moderation in sodium/caffeine intake, saturated fat and cholesterol, caloric balance, sufficient intake of fresh fruits, vegetables, fiber, calcium, iron, and 1 mg of folate supplement per day (for females capable of pregnancy).   [x]      Stressed the importance  of regular exercise.    [x]     Substance Abuse: Discussed cessation/primary prevention of tobacco, alcohol, or other drug use; driving or other dangerous activities under the influence; availability of treatment for abuse.    [x]      Injury prevention: Discussed safety belts, safety helmets, smoke detector, smoking near bedding or upholstery.    [x]      Sexuality: Discussed sexually transmitted diseases, partner selection, use of condoms, avoidance of unintended pregnancy  and contraceptive alternatives.    [x]     Dental health: Discussed importance of regular tooth brushing, flossing, and dental visits.   [x]      Health  maintenance and immunizations reviewed. Please refer to Health maintenance section.   Briscoe Deutscher, DO [place]

## 2017-08-24 ENCOUNTER — Ambulatory Visit: Payer: 59 | Admitting: Family Medicine

## 2017-08-24 ENCOUNTER — Encounter: Payer: Self-pay | Admitting: Family Medicine

## 2017-08-24 VITALS — BP 126/84 | HR 80 | Temp 98.4°F | Ht 62.0 in | Wt 177.2 lb

## 2017-08-24 DIAGNOSIS — F5104 Psychophysiologic insomnia: Secondary | ICD-10-CM

## 2017-08-24 DIAGNOSIS — R5383 Other fatigue: Secondary | ICD-10-CM | POA: Diagnosis not present

## 2017-08-24 DIAGNOSIS — R635 Abnormal weight gain: Secondary | ICD-10-CM

## 2017-08-24 DIAGNOSIS — Z0000 Encounter for general adult medical examination without abnormal findings: Secondary | ICD-10-CM | POA: Diagnosis not present

## 2017-08-24 DIAGNOSIS — Z13220 Encounter for screening for lipoid disorders: Secondary | ICD-10-CM | POA: Diagnosis not present

## 2017-08-24 DIAGNOSIS — E559 Vitamin D deficiency, unspecified: Secondary | ICD-10-CM | POA: Diagnosis not present

## 2017-08-24 LAB — COMPREHENSIVE METABOLIC PANEL
ALT: 20 U/L (ref 0–35)
AST: 19 U/L (ref 0–37)
Albumin: 4.7 g/dL (ref 3.5–5.2)
Alkaline Phosphatase: 69 U/L (ref 39–117)
BUN: 10 mg/dL (ref 6–23)
CO2: 24 mEq/L (ref 19–32)
Calcium: 9.6 mg/dL (ref 8.4–10.5)
Chloride: 103 mEq/L (ref 96–112)
Creatinine, Ser: 0.81 mg/dL (ref 0.40–1.20)
GFR: 83.62 mL/min (ref >60.00)
Glucose, Bld: 83 mg/dL (ref 70–99)
Potassium: 4.3 mEq/L (ref 3.5–5.1)
Sodium: 136 mEq/L (ref 135–145)
Total Bilirubin: 0.7 mg/dL (ref 0.2–1.2)
Total Protein: 8.1 g/dL (ref 6.0–8.3)

## 2017-08-24 LAB — CBC WITH DIFFERENTIAL/PLATELET
Basophils Absolute: 0.1 10*3/uL (ref 0.0–0.1)
Basophils Relative: 1.1 % (ref 0.0–3.0)
Eosinophils Absolute: 0.2 10*3/uL (ref 0.0–0.7)
Eosinophils Relative: 1.8 % (ref 0.0–5.0)
HCT: 43.2 % (ref 36.0–46.0)
Hemoglobin: 14.8 g/dL (ref 12.0–15.0)
Lymphocytes Relative: 30.5 % (ref 12.0–46.0)
Lymphs Abs: 2.6 10*3/uL (ref 0.7–4.0)
MCHC: 34.1 g/dL (ref 30.0–36.0)
MCV: 91.8 fl (ref 78.0–100.0)
Monocytes Absolute: 0.6 10*3/uL (ref 0.1–1.0)
Monocytes Relative: 7 % (ref 3.0–12.0)
Neutro Abs: 5 10*3/uL (ref 1.4–7.7)
Neutrophils Relative %: 59.6 % (ref 43.0–77.0)
Platelets: 314 10*3/uL (ref 150.0–400.0)
RBC: 4.71 Mil/uL (ref 3.87–5.11)
RDW: 13.3 % (ref 11.5–15.5)
WBC: 8.4 10*3/uL (ref 4.0–10.5)

## 2017-08-24 LAB — VITAMIN D 25 HYDROXY (VIT D DEFICIENCY, FRACTURES): VITD: 24.67 ng/mL — ABNORMAL LOW (ref 30.00–100.00)

## 2017-08-24 LAB — LIPID PANEL
Cholesterol: 185 mg/dL (ref 0–200)
HDL: 68.3 mg/dL (ref >39.00)
LDL Cholesterol: 93 mg/dL (ref 0–99)
NonHDL: 117.05
Total CHOL/HDL Ratio: 3
Triglycerides: 118 mg/dL (ref 0.0–149.0)
VLDL: 23.6 mg/dL (ref 0.0–40.0)

## 2017-08-24 LAB — TSH: TSH: 2.36 u[IU]/mL (ref 0.35–4.50)

## 2017-08-24 LAB — HEMOGLOBIN A1C: Hgb A1c MFr Bld: 5.2 % (ref 4.6–6.5)

## 2017-08-24 LAB — VITAMIN B12: Vitamin B-12: 376 pg/mL (ref 211–911)

## 2017-08-24 MED ORDER — PHENTERMINE HCL 37.5 MG PO TABS: 37.5000 mg | ORAL_TABLET | Freq: Every day | ORAL | 0 refills | Status: DC

## 2017-08-24 MED ORDER — TRAZODONE HCL 50 MG PO TABS: 25.0000 mg | ORAL_TABLET | Freq: Every evening | ORAL | 3 refills | Status: DC | PRN

## 2017-08-24 MED FILL — PHENTERMINE 37.5 MG TABLET: 37.5 | 30 days supply | Qty: 30 | Fill #0

## 2017-08-24 MED FILL — traZODone HCL 50 MG TABS: 50 | 30 days supply | Qty: 30 | Fill #0

## 2017-08-30 ENCOUNTER — Encounter: Payer: Self-pay | Admitting: Family Medicine

## 2017-08-30 DIAGNOSIS — E559 Vitamin D deficiency, unspecified: Secondary | ICD-10-CM | POA: Insufficient documentation

## 2017-09-23 ENCOUNTER — Ambulatory Visit: Payer: 59 | Admitting: Family Medicine

## 2017-09-23 ENCOUNTER — Encounter: Payer: Self-pay | Admitting: Family Medicine

## 2017-09-23 VITALS — BP 122/64 | HR 85 | Temp 98.9°F | Ht 62.0 in | Wt 162.4 lb

## 2017-09-23 DIAGNOSIS — R635 Abnormal weight gain: Secondary | ICD-10-CM | POA: Diagnosis not present

## 2017-09-23 DIAGNOSIS — E669 Obesity, unspecified: Secondary | ICD-10-CM | POA: Diagnosis not present

## 2017-09-23 DIAGNOSIS — F5104 Psychophysiologic insomnia: Secondary | ICD-10-CM

## 2017-09-23 MED ORDER — TRAZODONE HCL 50 MG PO TABS: 25.0000 mg | ORAL_TABLET | Freq: Every evening | ORAL | 3 refills | Status: DC | PRN

## 2017-09-23 MED ORDER — PHENTERMINE HCL 37.5 MG PO TABS: 37.5000 mg | ORAL_TABLET | Freq: Every day | ORAL | 2 refills | Status: DC

## 2017-09-23 MED FILL — PHENTERMINE 37.5 MG TABLET: 37.5 | 30 days supply | Qty: 30 | Fill #0

## 2017-09-23 MED FILL — traZODone HCL 50 MG TABS: 50 | 90 days supply | Qty: 90 | Fill #0

## 2017-09-23 NOTE — Progress Notes (Signed)
Kelsey [name] is a 37 y.o. female is here for follow up.  History of Present Illness:   Kelsey [name], CMA acting as scribe for Dr. Briscoe Deutscher.   HPI: Patient in for follow up on weight. She is down 13 pounds from last visit. She has been taking Phentermine daily and has only noticed is dry mouth. She feels like it has been helpful. She has also changed to low carb diet. Getting 40 g carbs daily or less.   Health Maintenance Due  Topic Date Due  . PAP SMEAR  08/09/2009   Depression screen [facility] 2/9 08/24/2017 03/11/2016 02/12/2016  Decreased Interest 0 0 0  Down, Depressed, Hopeless 0 0 0  PHQ - 2 Score 0 0 0   PMHx, SurgHx, SocialHx, FamHx, Medications, and Allergies were reviewed in the Visit Navigator and updated as appropriate.   Patient Active Problem List   Diagnosis Date Noted  . Vitamin D deficiency 08/30/2017  . Metatarsalgia of both feet 08/28/2016  . 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 Left knee pain 02/20/2016  . Closed patellar dislocation, right, sequela 01/28/2016  . Colicky RUQ abdominal pain 11/18/2013  . Cervical radiculopathy at C6 11/20/2011  . Allergic rhinitis 12/11/2008   Social History   Tobacco Use  . Smoking status: Never Smoker  . Smokeless tobacco: Never Used  Substance Use Topics  . Alcohol use: No  . Drug use: No   Current Medications and Allergies:   Current Outpatient Medications:  .  phentermine (ADIPEX-P) 37.5 MG tablet, Take 1 tablet (37.5 mg total) by mouth daily before breakfast., Disp: 30 tablet, Rfl: 2 .  traZODone (DESYREL) 50 MG tablet, Take 0.5-1 tablets (25-50 mg total) by mouth at bedtime as needed for sleep., Disp: 90 tablet, Rfl: 3   Allergies  Allergen Reactions  . Sulfa Antibiotics Rash  . Sulfonamide Derivatives Rash   Review of Systems   Pertinent items are noted in the HPI. Otherwise, ROS is negative.  Vitals:   Vitals:   09/23/17 1004  BP: 122/64  Pulse: 85  Temp: 98.9 F (37.2 C)  TempSrc: Oral    SpO2: 98%  Weight: 162 lb 6.4 oz (73.7 kg)  Height: 5\' 2"  (1.575 m)     Body mass index is 29.7 kg/m.  Physical Exam:   Physical Exam  Constitutional: She appears well-nourished.  HENT:  Head: Normocephalic and atraumatic.  Eyes: Pupils are equal, round, and reactive to light. EOM are normal.  Neck: Normal range of motion. Neck supple.  Cardiovascular: Normal rate, regular rhythm, normal heart sounds and intact distal pulses.  Pulmonary/Chest: Effort normal.  Abdominal: Soft.  Skin: Skin is warm.  Psychiatric: She has a normal mood and affect. Her behavior is normal.  Nursing note and vitals reviewed.  Results for orders placed or performed in visit on 08/24/17  CBC with Differential/Platelet  Result Value Ref Range   WBC 8.4 4.0 - 10.5 K/uL   RBC 4.71 3.87 - 5.11 Mil/uL   Hemoglobin 14.8 12.0 - 15.0 g/dL   HCT 43.2 36.0 - 46.0 %   MCV 91.8 78.0 - 100.0 fl   MCHC 34.1 30.0 - 36.0 g/dL   RDW 13.3 11.5 - 15.5 %   Platelets 314.0 150.0 - 400.0 K/uL   Neutrophils Relative % 59.6 43.0 - 77.0 %   Lymphocytes Relative 30.5 12.0 - 46.0 %   Monocytes Relative 7.0 3.0 - 12.0 %   Eosinophils Relative 1.8 0.0 - 5.0 %  Basophils Relative 1.1 0.0 - 3.0 %   Neutro Abs 5.0 1.4 - 7.7 K/uL   Lymphs Abs 2.6 0.7 - 4.0 K/uL   Monocytes Absolute 0.6 0.1 - 1.0 K/uL   Eosinophils Absolute 0.2 0.0 - 0.7 K/uL   Basophils Absolute 0.1 0.0 - 0.1 K/uL  Comprehensive metabolic panel  Result Value Ref Range   Sodium 136 135 - 145 mEq/L   Potassium 4.3 3.5 - 5.1 mEq/L   Chloride 103 96 - 112 mEq/L   CO2 24 19 - 32 mEq/L   Glucose, Bld 83 70 - 99 mg/dL   BUN 10 6 - 23 mg/dL   Creatinine, Ser 0.81 0.40 - 1.20 mg/dL   Total Bilirubin 0.7 0.2 - 1.2 mg/dL   Alkaline Phosphatase 69 39 - 117 U/L   AST 19 0 - 37 U/L   ALT 20 0 - 35 U/L   Total Protein 8.1 6.0 - 8.3 g/dL   Albumin 4.7 3.5 - 5.2 g/dL   Calcium 9.6 8.4 - 10.5 mg/dL   GFR 83.62 >60.00 mL/min  Lipid panel  Result Value Ref Range    Cholesterol 185 0 - 200 mg/dL   Triglycerides 118.0 0.0 - 149.0 mg/dL   HDL 68.30 >39.00 mg/dL   VLDL 23.6 0.0 - 40.0 mg/dL   LDL Cholesterol 93 0 - 99 mg/dL   Total CHOL/HDL Ratio 3    NonHDL 117.05   TSH  Result Value Ref Range   TSH 2.36 0.35 - 4.50 uIU/mL  Vitamin B12  Result Value Ref Range   Vitamin B-12 376 211 - 911 pg/mL  VITAMIN D 25 Hydroxy (Vit-D Deficiency, Fractures)  Result Value Ref Range   VITD 24.67 (L) 30.00 - 100.00 ng/mL  Hemoglobin A1c  Result Value Ref Range   Hgb A1c MFr Bld 5.2 4.6 - 6.5 %    Assessment and Plan:   Kelsey [name] was seen today for follow-up.  Diagnoses and all orders for this visit:  Obesity (BMI 30.0-34.9) Comments: Doing well with weight loss. New goals: increase water, start cycling 4-5 times per week.  Weight gain -     phentermine (ADIPEX-P) 37.5 MG tablet; Take 1 tablet (37.5 mg total) by mouth daily before breakfast.  Psychophysiological insomnia -     traZODone (DESYREL) 50 MG tablet; Take 0.5-1 tablets (25-50 mg total) by mouth at bedtime as needed for sleep.    . Reviewed expectations re: course of current medical issues. . Discussed self-management of symptoms. . Outlined signs and symptoms indicating need for more acute intervention. . Patient verbalized understanding and all questions were answered. Marland Kitchen Health Maintenance issues including appropriate healthy diet, exercise, and smoking avoidance were discussed with patient. . See orders for this visit as documented in the electronic medical record. . Patient received an After Visit Summary.  Briscoe Deutscher, DO [place] [place] 09/26/2017  Future Appointments  Date Time Provider [place]  12/23/2017  9:00 AM Briscoe Deutscher, DO [facility]   CMA served as scribe during this visit. History, Physical, and Plan performed by medical provider. The above documentation has been reviewed and is accurate and complete. Briscoe Deutscher, D.O.

## 2017-09-26 ENCOUNTER — Encounter: Payer: Self-pay | Admitting: Family Medicine

## 2017-10-04 NOTE — Progress Notes (Signed)
   Kelsey [name] is a 37 y.o. female here for an acute visit.  History of Present Illness:   HPI: Abscess of right inner thigh. Started last week. Very painful. Using moist heat. No fever. No Hx of the same.   PMHx, SurgHx, SocialHx, Medications, and Allergies were reviewed in the Visit Navigator and updated as appropriate.  Current Medications:   Current Outpatient Medications:  .  phentermine (ADIPEX-P) 37.5 MG tablet, Take 1 tablet (37.5 mg total) by mouth daily before breakfast., Disp: 30 tablet, Rfl: 2 .  traZODone (DESYREL) 50 MG tablet, Take 0.5-1 tablets (25-50 mg total) by mouth at bedtime as needed for sleep., Disp: 90 tablet, Rfl: 3 .  doxycycline (VIBRA-TABS) 100 MG tablet, Take 1 tablet (100 mg total) by mouth 2 (two) times daily., Disp: 20 tablet, Rfl: 0 .  mupirocin ointment (BACTROBAN) 2 %, Place 1 application into the nose 2 (two) times daily., Disp: 22 g, Rfl: 0   Allergies  Allergen Reactions  . Sulfa Antibiotics Rash  . Sulfonamide Derivatives Rash   Review of Systems:   Pertinent items are noted in the HPI. Otherwise, ROS is negative.  Vitals:   Vitals:   10/05/17 0657  BP: 110/76  Pulse: 86  Temp: 97.6 F (36.4 C)  TempSrc: Oral  SpO2: 98%  Weight: 156 lb (70.8 kg)  Height: 5\' 2"  (1.575 m)     Body mass index is 28.53 kg/m.  Physical Exam:   Physical Exam  Constitutional: She appears well-nourished.  HENT:  Head: Normocephalic and atraumatic.  Eyes: Pupils are equal, round, and reactive to light. EOM are normal.  Neck: Normal range of motion. Neck supple.  Cardiovascular: Normal rate, regular rhythm, normal heart sounds and intact distal pulses.  Pulmonary/Chest: Effort normal.  Abdominal: Soft.  Skin: Skin is warm.  Left thigh with 2.5 cm diameter fluctuance abscess with surrounding erythema and induration. No streaking.   Psychiatric: She has a normal mood and affect. Her behavior is normal.  Nursing note and vitals  reviewed.  Assessment and Plan:   Diagnoses and all orders for this visit:  Abscess of left thigh Comments: Incision and drainage of left upper thigh abscess. Verbal informed consent obtained.  Pt aware of risks not limited to but including infection, bleeding, damage to near by organs. Prep: ETOH. Anesthesia: 1% lidocaine. Incision with 11 blade. Blood clots and purulent drainage from wound. Tolerated well with minimal blood loss. Routine postprocedure instructions d/w pt- keep area clean and bandaged, follow up if concerns/spreading erythema/pain.  Orders: -     doxycycline (VIBRA-TABS) 100 MG tablet; Take 1 tablet (100 mg total) by mouth 2 (two) times daily. -     mupirocin ointment (BACTROBAN) 2 %; Place 1 application into the nose 2 (two) times daily.  Obesity (BMI 30.0-34.9) Comments: Doing very well. Continue low carb. Recheck in 2-3 months.     . Reviewed expectations re: course of current medical issues. . Discussed self-management of symptoms. . Outlined signs and symptoms indicating need for more acute intervention. . Patient verbalized understanding and all questions were answered. Marland Kitchen Health Maintenance issues including appropriate healthy diet, exercise, and smoking avoidance were discussed with patient. . See orders for this visit as documented in the electronic medical record. . Patient received an After Visit Summary.  Briscoe Deutscher, DO [place], [facility] 10/05/2017

## 2017-10-05 ENCOUNTER — Telehealth: Payer: Self-pay | Admitting: Family Medicine

## 2017-10-05 ENCOUNTER — Ambulatory Visit: Payer: 59 | Admitting: Family Medicine

## 2017-10-05 ENCOUNTER — Encounter: Payer: Self-pay | Admitting: Family Medicine

## 2017-10-05 VITALS — BP 110/76 | HR 86 | Temp 97.6°F | Ht 62.0 in | Wt 156.0 lb

## 2017-10-05 DIAGNOSIS — L02416 Cutaneous abscess of left lower limb: Secondary | ICD-10-CM

## 2017-10-05 DIAGNOSIS — E669 Obesity, unspecified: Secondary | ICD-10-CM

## 2017-10-05 MED ORDER — DOXYCYCLINE HYCLATE 100 MG PO TABS: 100.0000 mg | ORAL_TABLET | Freq: Two times a day (BID) | ORAL | 0 refills | Status: DC

## 2017-10-05 MED ORDER — MUPIROCIN 2 % EX OINT: 1.0000 "application " | TOPICAL_OINTMENT | Freq: Two times a day (BID) | CUTANEOUS | 0 refills | Status: DC

## 2017-10-05 MED FILL — DOXYCYCLINE HYCLATE 100 MG: 100 | 10 days supply | Qty: 20 | Fill #0

## 2017-10-05 MED FILL — MUPIROCIN 2% OINTMENT: 2 | 5 days supply | Qty: 22 | Fill #0

## 2017-10-05 NOTE — Telephone Encounter (Signed)
Copied from [place] [id number]. Topic: General - Other >> Oct 05, 2017  8:15 AM Lennox Solders wrote: Reason for ZES:PQZRA pharmacist [facility] needs clarification on bactroban should it be applied in nose . The patient said it should be on her leg. Pt was seen today

## 2017-10-05 NOTE — Telephone Encounter (Signed)
Notified pharmacy that directions should have been for the leg.

## 2017-10-26 MED FILL — PHENTERMINE 37.5 MG TABLET: 37.5 | 30 days supply | Qty: 30 | Fill #1

## 2017-11-30 MED FILL — PHENTERMINE 37.5 MG TABLET: 37.5 | 30 days supply | Qty: 30 | Fill #2

## 2017-12-23 ENCOUNTER — Ambulatory Visit: Payer: 59 | Admitting: Family Medicine

## 2018-01-23 ENCOUNTER — Encounter: Payer: Self-pay | Admitting: Family Medicine

## 2018-01-23 DIAGNOSIS — E663 Overweight: Secondary | ICD-10-CM | POA: Insufficient documentation

## 2018-01-23 DIAGNOSIS — Z9049 Acquired absence of other specified parts of digestive tract: Secondary | ICD-10-CM | POA: Insufficient documentation

## 2018-01-23 DIAGNOSIS — F19982 Other psychoactive substance use, unspecified with psychoactive substance-induced sleep disorder: Secondary | ICD-10-CM | POA: Insufficient documentation

## 2018-01-23 DIAGNOSIS — E538 Deficiency of other specified B group vitamins: Secondary | ICD-10-CM | POA: Insufficient documentation

## 2018-01-23 HISTORY — DX: Overweight: E66.3

## 2018-01-23 NOTE — Assessment & Plan Note (Signed)
History: Associated symptoms include none. The patient has been taking: Trazodone. Side effects from the medication: none.  Assessment/Plan: 1. Discussed sleep hygiene measures including regular sleep schedule, optimal sleep environment, and relaxing presleep rituals. 2. Avoid daytime naps. 3. Avoid caffeine after noon. 4. Avoid excess alcohol. 5. Avoid tobacco. 6. Recommended daily exercise. 7. Scientist, clinical (histocompatibility and immunogenetics) distributed. 8. Prescription provided today.

## 2018-01-23 NOTE — Progress Notes (Signed)
Kelsey [name] is a 37 y.o. female is here for follow up.   History of Present Illness:   Lonell Grandchild, CMA acting as scribe for Dr. Briscoe Deutscher.  HPI: Patient in office for follow up on Phentermine 37.5 daily. She has lost 23 lbs from last visit on 10/05/17. She has continued to work on portion control and exercise. She denies any side effects from medications. She has been sleeping between 7-8 hours a night. The Trazodone is helping to get good uninterrupted sleep.   Health Maintenance Due  Topic Date Due  . PAP SMEAR  08/10/2011   Depression screen [facility] 2/9 08/24/2017 03/11/2016 02/12/2016  Decreased Interest 0 0 0  Down, Depressed, Hopeless 0 0 0  PHQ - 2 Score 0 0 0   PMHx, SurgHx, SocialHx, FamHx, Medications, and Allergies were reviewed in the Visit Navigator and updated as appropriate.   Patient Active Problem List   Diagnosis Date Noted  . Difficulty maintaining weight 01/25/2018  . B12 deficiency 01/23/2018  . History of cholecystectomy 01/23/2018  . 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 Vitamin D deficiency 08/30/2017  . Metatarsalgia of both feet, L > R, with arch strain 08/28/2016  . 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 Left knee pain 02/20/2016  . Closed patellar dislocation, right, sequela 01/28/2016  . Cervical radiculopathy at C6 11/20/2011  . Allergic rhinitis 12/11/2008   Social History   Tobacco Use  . Smoking status: Never Smoker  . Smokeless tobacco: Never Used  Substance Use Topics  . Alcohol use: No  . Drug use: No   Current Medications and Allergies:   .  phentermine (ADIPEX-P) 37.5 MG tablet, Take 1 tablet (37.5 mg total) by mouth daily before breakfast., Disp: 30 tablet, Rfl: 2 .  traZODone (DESYREL) 50 MG tablet, Take 0.5-1 tablets (25-50 mg total) by mouth at bedtime as needed for sleep., Disp: 90 tablet, Rfl: 3   Allergies  Allergen Reactions  . Sulfa Antibiotics Rash  . Sulfonamide Derivatives Rash   Review of Systems   Pertinent  items are noted in the HPI. Otherwise, ROS is negative.  Vitals:   Vitals:   01/25/18 0705  BP: 110/74  Pulse: 84  Temp: 98.3 F (36.8 C)  TempSrc: Oral  SpO2: 99%  Weight: 133 lb 6.4 oz (60.5 kg)  Height: 5\' 2"  (1.575 m)     Body mass index is 24.4 kg/m.  Physical Exam:   Physical Exam  Constitutional: She appears well-nourished.  HENT:  Head: Normocephalic and atraumatic.  Eyes: Pupils are equal, round, and reactive to light. EOM are normal.  Neck: Normal range of motion. Neck supple.  Cardiovascular: Normal rate, regular rhythm, normal heart sounds and intact distal pulses.  Pulmonary/Chest: Effort normal.  Abdominal: Soft.  Skin: Skin is warm.  Psychiatric: She has a normal mood and affect. Her behavior is normal.  Nursing note and vitals reviewed.  Assessment and Plan:   Vitamin D deficiency History: Current vitamin D level is 24.67. Assessment/Plan:       Goal vitamin D level > 40. Plan: Vitamin D supplementation.  Insomnia due to drug [facility]) History: Associated symptoms include none. The patient has been taking: Trazodone. Side effects from the medication: none.  Assessment/Plan: 1. Discussed sleep hygiene measures including regular sleep schedule, optimal sleep environment, and relaxing presleep rituals. 2. Avoid daytime naps. 3. Avoid caffeine after noon. 4. Avoid excess alcohol. 5. Avoid tobacco. 6. Recommended daily exercise. 7. Scientist, clinical (histocompatibility and immunogenetics) distributed. 8.  Prescription provided today.  Difficulty maintaining weight History: Patient is doing very well. She has new habits in place that will help her through. We did discuss medications in the future if needed (qsymia, wellbutrin, saxenda).  Assessment/Plan: Continue prn phentermine only (for holidays, menses, etc). Recheck in 3 months.  Meds ordered this encounter  Medications  . phentermine (ADIPEX-P) 37.5 MG tablet    Sig: Take 1 tablet (37.5 mg total) by mouth daily before breakfast.      Dispense:  30 tablet    Refill:  2   . Reviewed expectations re: course of current medical issues. . Discussed self-management of symptoms. . Outlined signs and symptoms indicating need for more acute intervention. . Patient verbalized understanding and all questions were answered. Marland Kitchen Health Maintenance issues including appropriate healthy diet, exercise, and smoking avoidance were discussed with patient. . See orders for this visit as documented in the electronic medical record. . Patient received an After Visit Summary.  Briscoe Deutscher, DO [place], [facility] 01/25/2018

## 2018-01-23 NOTE — Assessment & Plan Note (Signed)
History: Current vitamin D level is 24.67. Assessment/Plan:       Goal vitamin D level > 40. Plan: Vitamin D supplementation.

## 2018-01-25 ENCOUNTER — Ambulatory Visit: Payer: 59 | Admitting: Family Medicine

## 2018-01-25 ENCOUNTER — Encounter: Payer: Self-pay | Admitting: Family Medicine

## 2018-01-25 VITALS — BP 110/74 | HR 84 | Temp 98.3°F | Ht 62.0 in | Wt 133.4 lb

## 2018-01-25 DIAGNOSIS — R638 Other symptoms and signs concerning food and fluid intake: Secondary | ICD-10-CM | POA: Diagnosis not present

## 2018-01-25 DIAGNOSIS — F19982 Other psychoactive substance use, unspecified with psychoactive substance-induced sleep disorder: Secondary | ICD-10-CM

## 2018-01-25 MED ORDER — PHENTERMINE HCL 37.5 MG PO TABS: 37.5000 mg | ORAL_TABLET | Freq: Every day | ORAL | 2 refills | Status: DC

## 2018-01-25 MED FILL — PHENTERMINE 37.5 MG TABLET: 37.5 | 30 days supply | Qty: 30 | Fill #0

## 2018-01-25 NOTE — Assessment & Plan Note (Signed)
History: Patient is doing very well. She has new habits in place that will help her through. We did discuss medications in the future if needed (qsymia, wellbutrin, saxenda).  Assessment/Plan: Continue prn phentermine only (for holidays, menses, etc). Recheck in 3 months.

## 2018-01-25 NOTE — Patient Instructions (Addendum)
Health Maintenance Due  Topic Date Due  . PAP SMEAR  08/10/2011

## 2018-01-31 IMAGING — DX DG KNEE COMPLETE 4+V*R*
4 series · 4 of 4 positions shown · non-contrast
Comparison: None.

CLINICAL DATA: Patellar dislocation today.

EXAM:
RIGHT KNEE - COMPLETE 4+ VIEW

[knee ap]
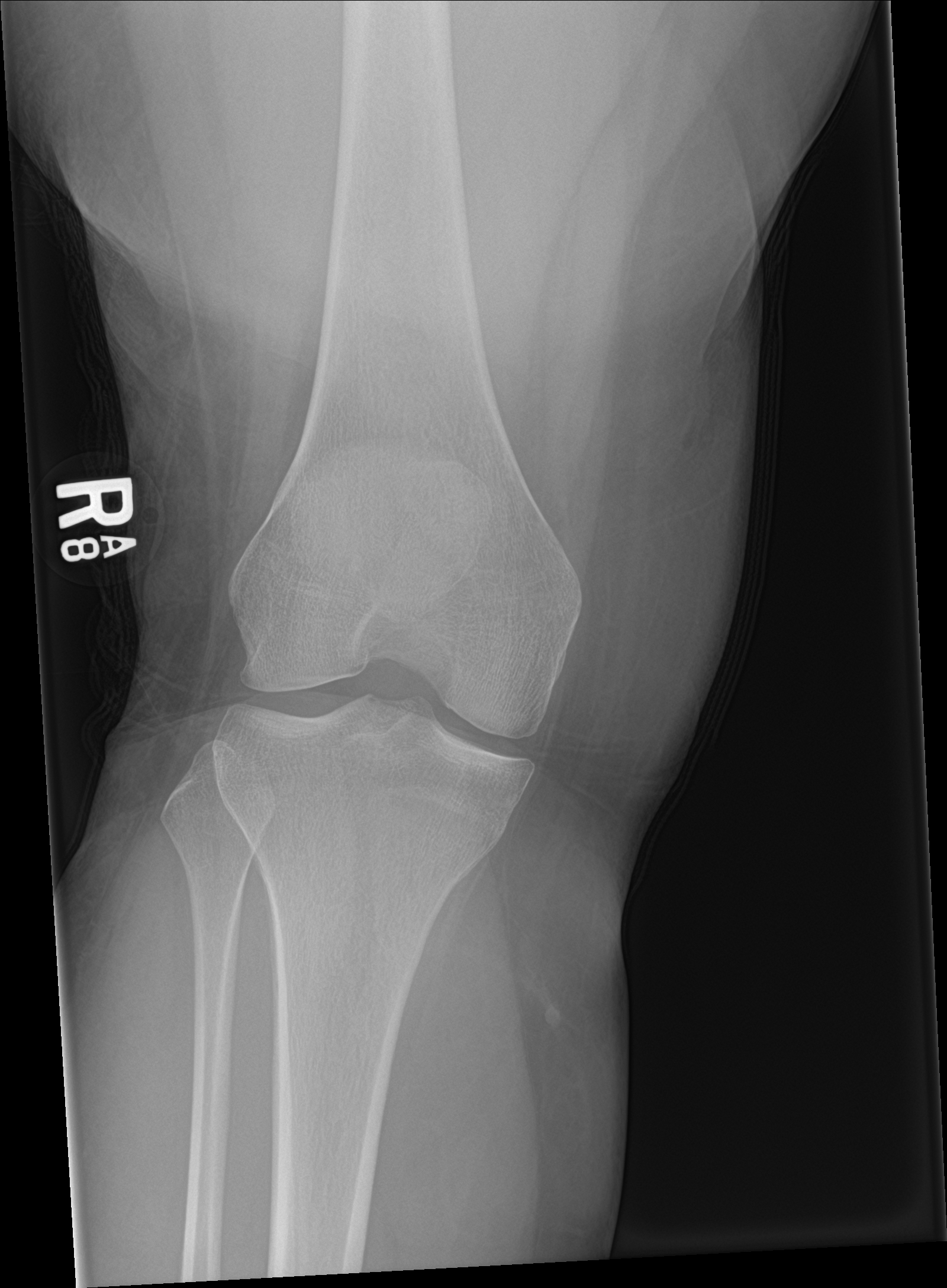

[knee lat]
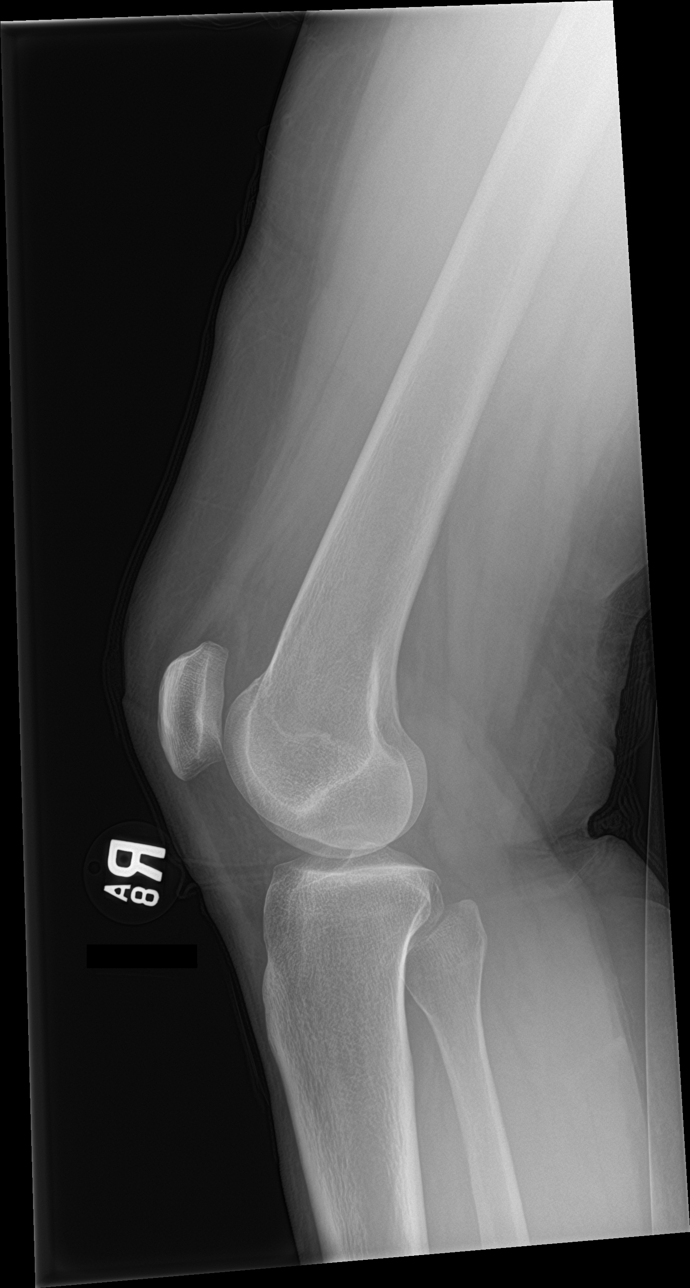

[knee obl (1 of 2)]
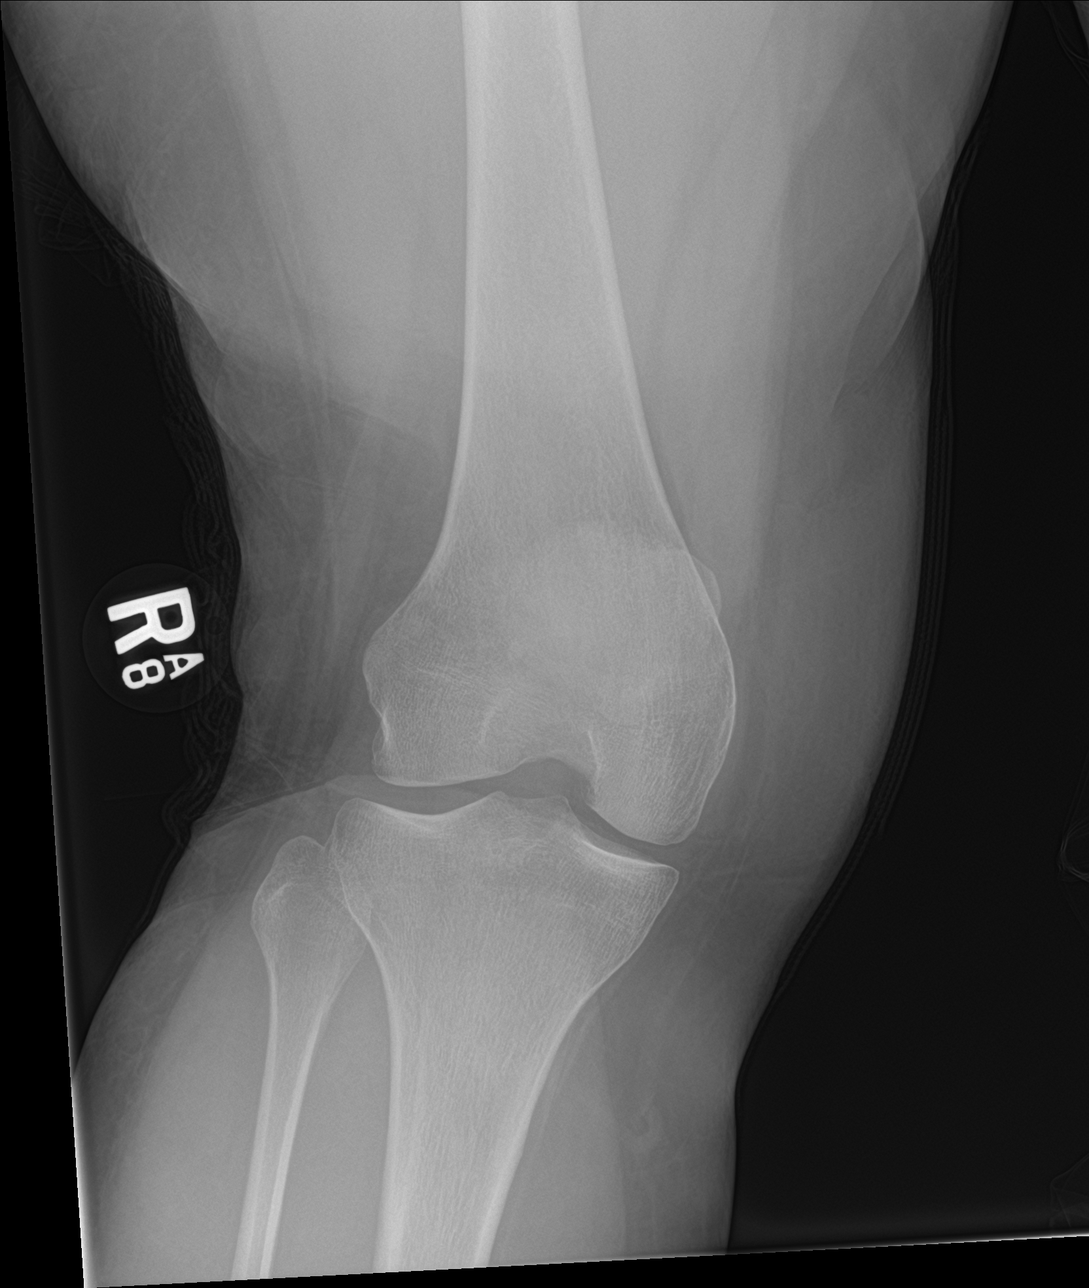

[knee obl (2 of 2)]
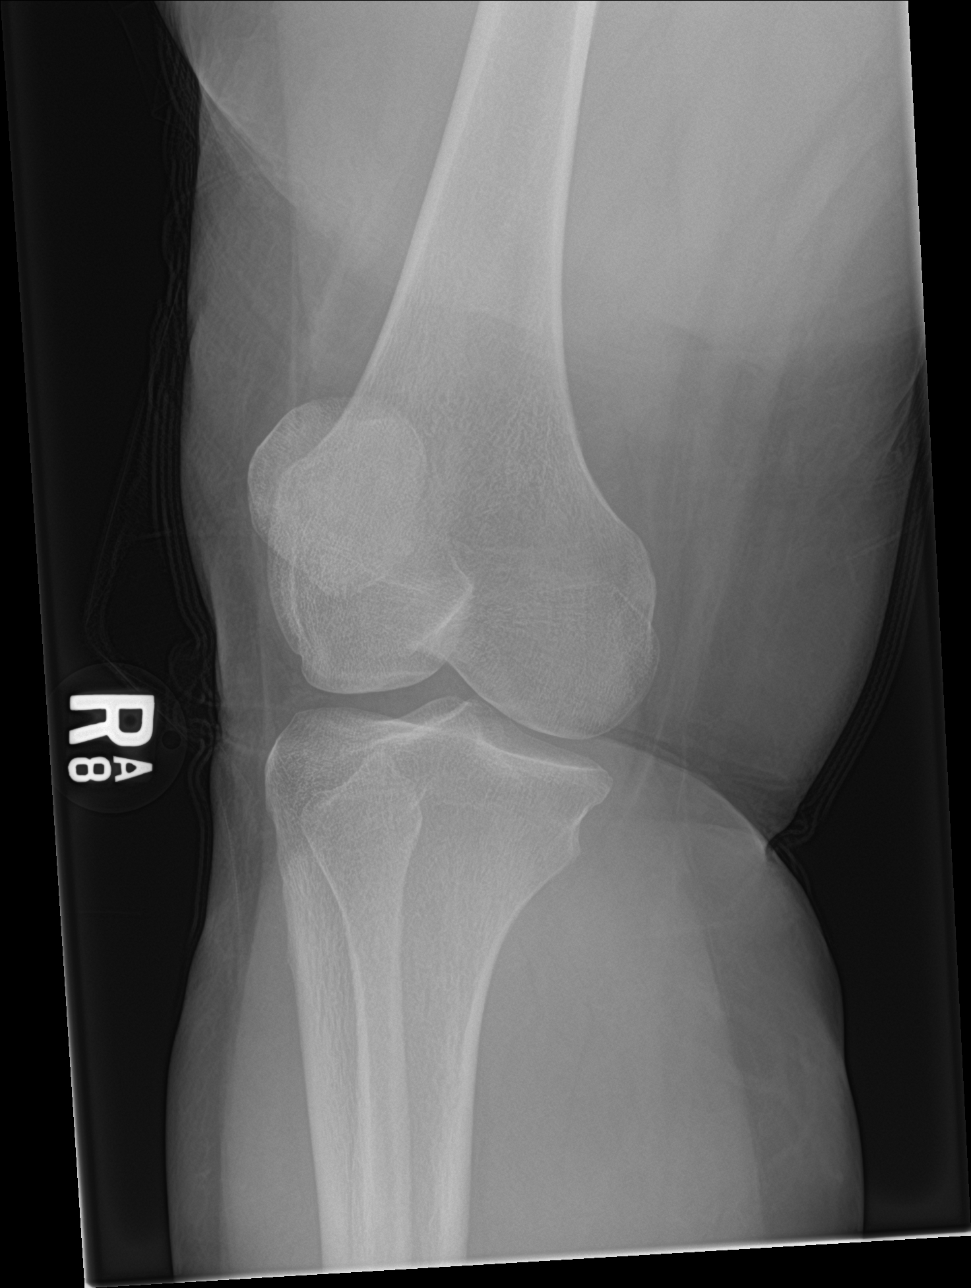

[4 of 4 positions shown; findings below may reference images not displayed]

FINDINGS: No evidence of fracture, dislocation, or joint effusion. No evidence
of arthropathy or other focal bone abnormality. Soft tissues are
unremarkable.
IMPRESSION: Negative.

## 2018-02-11 MED FILL — traZODone HCL 50 MG TABS: 50 | 90 days supply | Qty: 90 | Fill #1

## 2018-04-20 DIAGNOSIS — D2261 Melanocytic nevi of right upper limb, including shoulder: Secondary | ICD-10-CM | POA: Diagnosis not present

## 2018-04-20 DIAGNOSIS — L918 Other hypertrophic disorders of the skin: Secondary | ICD-10-CM | POA: Diagnosis not present

## 2018-04-20 DIAGNOSIS — D225 Melanocytic nevi of trunk: Secondary | ICD-10-CM | POA: Diagnosis not present

## 2018-04-20 DIAGNOSIS — D2372 Other benign neoplasm of skin of left lower limb, including hip: Secondary | ICD-10-CM | POA: Diagnosis not present

## 2018-04-20 DIAGNOSIS — D2272 Melanocytic nevi of left lower limb, including hip: Secondary | ICD-10-CM | POA: Diagnosis not present

## 2018-04-20 DIAGNOSIS — D1801 Hemangioma of skin and subcutaneous tissue: Secondary | ICD-10-CM | POA: Diagnosis not present

## 2018-04-20 DIAGNOSIS — L821 Other seborrheic keratosis: Secondary | ICD-10-CM | POA: Diagnosis not present

## 2018-04-20 DIAGNOSIS — D2262 Melanocytic nevi of left upper limb, including shoulder: Secondary | ICD-10-CM | POA: Diagnosis not present

## 2018-04-20 DIAGNOSIS — D2271 Melanocytic nevi of right lower limb, including hip: Secondary | ICD-10-CM | POA: Diagnosis not present

## 2018-04-27 ENCOUNTER — Encounter: Payer: Self-pay | Admitting: Family Medicine

## 2018-04-27 ENCOUNTER — Ambulatory Visit: Payer: 59 | Admitting: Family Medicine

## 2018-04-27 VITALS — BP 102/70 | HR 68 | Temp 98.0°F | Ht 62.0 in | Wt 133.0 lb

## 2018-04-27 DIAGNOSIS — Z0000 Encounter for general adult medical examination without abnormal findings: Secondary | ICD-10-CM | POA: Diagnosis not present

## 2018-04-27 NOTE — Patient Instructions (Signed)
YOU ARE A ROCK STAR!  DRINK MORE WATER!

## 2018-04-27 NOTE — Progress Notes (Signed)
Subjective:    Kelsey [name] is a 38 y.o. female and is here for a comprehensive physical exam.   Current Outpatient Medications:  .  traZODone (DESYREL) 50 MG tablet, Take 0.5-1 tablets (25-50 mg total) by mouth at bedtime as needed for sleep., Disp: 90 tablet, Rfl: 3 .  phentermine (ADIPEX-P) 37.5 MG tablet, Take 1 tablet (37.5 mg total) by mouth daily before breakfast. (Patient not taking: Reported on 04/27/2018), Disp: 30 tablet, Rfl: 2  Health Maintenance Due  Topic Date Due  . PAP SMEAR-Modifier  08/10/2011    PMHx, SurgHx, SocialHx, Medications, and Allergies were reviewed in the Visit Navigator and updated as appropriate.   Past Medical History:  Diagnosis Date  . Headache(784.0)   . Overweight (BMI 25.0-29.9) 01/23/2018  . PONV (postoperative nausea and vomiting)   . Pregnancy induced hypertension      Past Surgical History:  Procedure Laterality Date  . CESAREAN SECTION  2008, 2013  . CHOLECYSTECTOMY  12/2013  . DILATION AND EVACUATION  10/04/2010   Procedure: DILATATION AND EVACUATION (D&E);  Surgeon: Marylynn Pearson;  Location: [facility];  Service: Gynecology;  Laterality: N/A;  pt last ate at 8:30am  . WISDOM TOOTH EXTRACTION  2006     Family History  Problem Relation Age of Onset  . Cancer Mother   . Hypertension Father   . Alcohol abuse Father   . Heart disease Father     Social History   Tobacco Use  . Smoking status: Never Smoker  . Smokeless tobacco: Never Used  Substance Use Topics  . Alcohol use: No  . Drug use: No    Review of Systems:   Pertinent items are noted in the HPI. Otherwise, ROS is negative.  Objective:   BP 102/70 (BP Location: Left Arm, Patient Position: Sitting, Cuff Size: Normal)   Pulse 68   Temp 98 F (36.7 C) (Oral)   Ht 5\' 2"  (1.575 m)   Wt 133 lb (60.3 kg)   LMP 04/25/2018   SpO2 99%   BMI 24.33 kg/m    General appearance: alert, cooperative and appears stated age. Head: normocephalic, without obvious  abnormality, atraumatic. Neck: no adenopathy, supple, symmetrical, trachea midline; thyroid not enlarged, symmetric, no tenderness/mass/nodules. Lungs: clear to auscultation bilaterally. Heart: regular rate and rhythm Abdomen: soft, non-tender; no masses,  no organomegaly. Extremities: extremities normal, atraumatic, no cyanosis or edema. Skin: skin color, texture, turgor normal, no rashes or lesions. Lymph: cervical, supraclavicular, and axillary nodes normal; no abnormal inguinal nodes palpated. Neurologic: grossly normal.  Assessment/Plan:   Kelsey [name] was seen today for annual exam.  Diagnoses and all orders for this visit:  Routine physical examination    Patient Counseling:   [x]     Nutrition: Stressed importance of moderation in sodium/caffeine intake, saturated fat and cholesterol, caloric balance, sufficient intake of fresh fruits, vegetables, fiber, calcium, iron, and 1 mg of folate supplement per day (for females capable of pregnancy).   [x]      Stressed the importance of regular exercise.    [x]     Substance Abuse: Discussed cessation/primary prevention of tobacco, alcohol, or other drug use; driving or other dangerous activities under the influence; availability of treatment for abuse.    [x]      Injury prevention: Discussed safety belts, safety helmets, smoke detector, smoking near bedding or upholstery.    [x]      Sexuality: Discussed sexually transmitted diseases, partner selection, use of condoms, avoidance of unintended pregnancy  and contraceptive alternatives.    [  x]    Dental health: Discussed importance of regular tooth brushing, flossing, and dental visits.   [x]      Health maintenance and immunizations reviewed. Please refer to Health maintenance section.   Briscoe Deutscher, DO [place]

## 2018-05-06 ENCOUNTER — Encounter: Payer: Self-pay | Admitting: Family Medicine

## 2018-05-26 MED FILL — traZODone HCL 50 MG TABS: 50 | 90 days supply | Qty: 90 | Fill #2

## 2018-09-15 MED FILL — traZODone HCL 50 MG TABS: 50 | 90 days supply | Qty: 90 | Fill #3

## 2018-12-19 NOTE — Progress Notes (Signed)
Virtual Visit via Video   Due to the COVID-19 pandemic, this visit was completed with telemedicine (audio/video) technology to reduce patient and provider exposure as well as to preserve personal protective equipment.   I connected with Kelsey [name] by a video enabled telemedicine application and verified that I am speaking with the correct person using two identifiers. Location patient: Home Location provider: [facility], Office Persons participating in the virtual visit: Kelsey [name], Briscoe Deutscher, DO   I discussed the limitations of evaluation and management by telemedicine and the availability of in person appointments. The patient expressed understanding and agreed to proceed.  Care Team   Patient Care Team: Briscoe Deutscher, DO as PCP - General (Family Medicine) Kennith Center, RD as Dietitian (Family Medicine) Stefanie Libel, MD as Consulting Physician ([place]) Katy Apo, MD as Consulting Physician (Ophthalmology) Gery Pray, DDS as Consulting Physician (Dentistry)  Subjective:   HPI: Weight gain 2022/10/29 pounds. COVID stress. Death in the family. Has not been exercising. Ready to get back to [place]. Did well with that and Phentermine, along with food planning and logging, last year.   Patient Active Problem List   Diagnosis Date Noted  . 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 Difficulty maintaining weight 01/25/2018  . B12 deficiency 01/23/2018  . History of cholecystectomy 01/23/2018  . 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 Vitamin D deficiency 08/30/2017  . Metatarsalgia of both feet, L > R, with arch strain 08/28/2016  . 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 Left knee pain 02/20/2016  . Closed patellar dislocation, right, sequela 01/28/2016  . Cervical radiculopathy at C6 11/20/2011  . Allergic rhinitis 12/11/2008    Social History   Tobacco Use  . Smoking status: Never Smoker  . Smokeless tobacco: Never Used  Substance Use Topics  .  Alcohol use: No    Current Outpatient Medications:  .  traZODone (DESYREL) 50 MG tablet, Take 0.5-1 tablets (25-50 mg total) by mouth at bedtime as needed for sleep., Disp: 90 tablet, Rfl: 3 .  phentermine (ADIPEX-P) 37.5 MG tablet, Take 1 tablet (37.5 mg total) by mouth daily before breakfast., Disp: 30 tablet, Rfl: 4  Allergies  Allergen Reactions  . Sulfa Antibiotics Rash  . Sulfonamide Derivatives Rash    Objective:   VITALS: Per patient if applicable, see vitals. GENERAL: Alert, appears well and in no acute distress. HEENT: Atraumatic, conjunctiva clear, no obvious abnormalities on inspection of external nose and ears. NECK: Normal movements of the head and neck. CARDIOPULMONARY: No increased WOB. Speaking in clear sentences. I:E ratio WNL.  MS: Moves all visible extremities without noticeable abnormality. PSYCH: Pleasant and cooperative, well-groomed. Speech normal rate and rhythm. Affect is appropriate. Insight and judgement are appropriate. Attention is focused, linear, and appropriate.  NEURO: CN grossly intact. Oriented as arrived to appointment on time with no prompting. Moves both UE equally.  SKIN: No obvious lesions, wounds, erythema, or cyanosis noted on face or hands.  Depression screen [facility] 2/9 04/27/2018 08/24/2017 03/11/2016  Decreased Interest 0 0 0  Down, Depressed, Hopeless 0 0 0  PHQ - 2 Score 0 0 0  Altered sleeping 0 - -  Tired, decreased energy 0 - -  Change in appetite 0 - -  Feeling bad or failure about yourself  0 - -  Trouble concentrating 0 - -  Moving slowly or fidgety/restless 0 - -  Suicidal thoughts 0 - -  PHQ-9 Score 0 - -    Assessment and  Plan:   Kelsey [name] was seen today for follow-up.  Diagnoses and all orders for this visit:  Psychophysiological insomnia -     traZODone (DESYREL) 50 MG tablet; Take 0.5-1 tablets (25-50 mg total) by mouth at bedtime as needed for sleep.  Weight gain -     phentermine (ADIPEX-P) 37.5 MG tablet; Take 1  tablet (37.5 mg total) by mouth daily before breakfast.  Encounter for screening mammogram for malignant neoplasm of breast -     MM DIGITAL SCREENING BILATERAL; Future  Situational stress    . COVID-19 Education: The signs and symptoms of COVID-19 were discussed with the patient and how to seek care for testing if needed. The importance of social distancing was discussed today. . Reviewed expectations re: course of current medical issues. . Discussed self-management of symptoms. . Outlined signs and symptoms indicating need for more acute intervention. . Patient verbalized understanding and all questions were answered. Marland Kitchen Health Maintenance issues including appropriate healthy diet, exercise, and smoking avoidance were discussed with patient. . See orders for this visit as documented in the electronic medical record.  Briscoe Deutscher, DO  Records requested if needed. Time spent: 15 minutes, of which >50% was spent in obtaining information about her symptoms, reviewing her previous labs, evaluations, and treatments, counseling her about her condition (please see the discussed topics above), and developing a plan to further investigate it; she had a number of questions which I addressed.

## 2018-12-20 ENCOUNTER — Ambulatory Visit (INDEPENDENT_AMBULATORY_CARE_PROVIDER_SITE_OTHER): Payer: 59 | Admitting: Family Medicine

## 2018-12-20 VITALS — Ht 62.0 in | Wt 145.0 lb

## 2018-12-20 DIAGNOSIS — F5104 Psychophysiologic insomnia: Secondary | ICD-10-CM

## 2018-12-20 DIAGNOSIS — R635 Abnormal weight gain: Secondary | ICD-10-CM | POA: Diagnosis not present

## 2018-12-20 DIAGNOSIS — F439 Reaction to severe stress, unspecified: Secondary | ICD-10-CM | POA: Diagnosis not present

## 2018-12-20 DIAGNOSIS — Z1231 Encounter for screening mammogram for malignant neoplasm of breast: Secondary | ICD-10-CM

## 2018-12-23 ENCOUNTER — Encounter: Payer: Self-pay | Admitting: Family Medicine

## 2018-12-23 DIAGNOSIS — F5104 Psychophysiologic insomnia: Secondary | ICD-10-CM | POA: Insufficient documentation

## 2018-12-23 MED ORDER — TRAZODONE HCL 50 MG PO TABS: 25.0000 mg | ORAL_TABLET | Freq: Every evening | ORAL | 3 refills | Status: DC | PRN

## 2018-12-23 MED ORDER — PHENTERMINE HCL 37.5 MG PO TABS: 37.5000 mg | ORAL_TABLET | Freq: Every day | ORAL | 4 refills | Status: DC

## 2018-12-23 MED FILL — traZODone HCL 50 MG TABS: 50 | 90 days supply | Qty: 90 | Fill #0

## 2018-12-23 MED FILL — PHENTERMINE 37.5 MG TABLET: 37.5 | 30 days supply | Qty: 30 | Fill #0

## 2019-01-21 ENCOUNTER — Other Ambulatory Visit: Payer: Self-pay | Admitting: Family Medicine

## 2019-01-21 ENCOUNTER — Other Ambulatory Visit: Payer: Self-pay

## 2019-01-21 ENCOUNTER — Ambulatory Visit
Admission: RE | Admit: 2019-01-21 | Discharge: 2019-01-21 | Disposition: A | Discharge status: Home / Self Care | Payer: 59 | Source: Ambulatory Visit | Attending: Family Medicine | Admitting: Family Medicine

## 2019-01-21 DIAGNOSIS — Z1231 Encounter for screening mammogram for malignant neoplasm of breast: Secondary | ICD-10-CM

## 2019-01-27 ENCOUNTER — Ambulatory Visit: Payer: 59

## 2019-03-29 MED FILL — PHENTERMINE 37.5 MG TABLET: 37.5 | 30 days supply | Qty: 30 | Fill #1

## 2019-03-29 MED FILL — traZODone HCL 50 MG TABS: 50 | 90 days supply | Qty: 90 | Fill #1

## 2019-05-20 DIAGNOSIS — D1801 Hemangioma of skin and subcutaneous tissue: Secondary | ICD-10-CM | POA: Diagnosis not present

## 2019-05-20 DIAGNOSIS — D2271 Melanocytic nevi of right lower limb, including hip: Secondary | ICD-10-CM | POA: Diagnosis not present

## 2019-05-20 DIAGNOSIS — D2272 Melanocytic nevi of left lower limb, including hip: Secondary | ICD-10-CM | POA: Diagnosis not present

## 2019-05-20 DIAGNOSIS — D2262 Melanocytic nevi of left upper limb, including shoulder: Secondary | ICD-10-CM | POA: Diagnosis not present

## 2019-05-20 DIAGNOSIS — D2372 Other benign neoplasm of skin of left lower limb, including hip: Secondary | ICD-10-CM | POA: Diagnosis not present

## 2019-05-20 DIAGNOSIS — D2261 Melanocytic nevi of right upper limb, including shoulder: Secondary | ICD-10-CM | POA: Diagnosis not present

## 2019-05-20 DIAGNOSIS — D225 Melanocytic nevi of trunk: Secondary | ICD-10-CM | POA: Diagnosis not present

## 2019-06-20 MED FILL — PHENTERMINE 37.5 MG TABLET: 37.5 | 30 days supply | Qty: 30 | Fill #2

## 2019-06-20 MED FILL — traZODone HCL 50 MG TABS: 50 | 90 days supply | Qty: 90 | Fill #2

## 2020-01-04 NOTE — Progress Notes (Signed)
[facility] [address] [place] Phone: [phone] Subjective:   I Kelsey [name] am serving as a Education administrator for Dr. Hulan Saas.  This visit occurred during the SARS-CoV-2 public health emergency.  Safety protocols were in place, including screening questions prior to the visit, additional usage of staff PPE, and extensive cleaning of exam room while observing appropriate contact time as indicated for disinfecting solutions.   I'm seeing this patient by the request  of:  Inda Coke, [place]  CC: Neck pain  [id number]  Kelsey [name] is a 39 y.o. female coming in with complaint of neck pain. Patient states her pain started last Wednesday. Stiffness. Pain radiates to the occiput. Has tried heat, ice and ibuprofen. 5/10 at its worse. Pain keeps her up at night at times.  Patient denies any radiation down the arm, more aggravating.       Past Medical History:  Diagnosis Date  . Headache(784.0)   . Overweight (BMI 25.0-29.9) 01/23/2018  . PONV (postoperative nausea and vomiting)   . Pregnancy induced hypertension    Past Surgical History:  Procedure Laterality Date  . CESAREAN SECTION  2008, 2013  . CHOLECYSTECTOMY  12/2013  . DILATION AND EVACUATION  10/04/2010   Procedure: DILATATION AND EVACUATION (D&E);  Surgeon: Marylynn Pearson;  Location: [facility];  Service: Gynecology;  Laterality: N/A;  pt last ate at 8:30am  . WISDOM TOOTH EXTRACTION  2006   Social History   Socioeconomic History  . Marital status: Married    Spouse name: Not on file  . Number of children: Not on file  . Years of education: Not on file  . Highest education level: Not on file  Occupational History  . Not on file  Tobacco Use  . Smoking status: Never Smoker  . Smokeless tobacco: Never Used  Substance and Sexual Activity  . Alcohol use: No  . Drug use: No  . Sexual activity: Yes  Other Topics Concern  . Not on file  Social History Narrative    . Not on file   Social Determinants of Health   Financial Resource Strain:   . Difficulty of Paying Living Expenses: Not on file  Food Insecurity:   . Worried About Charity fundraiser in the Last Year: Not on file  . Ran Out of Food in the Last Year: Not on file  Transportation Needs:   . Lack of Transportation (Medical): Not on file  . Lack of Transportation (Non-Medical): Not on file  Physical Activity:   . Days of Exercise per Week: Not on file  . Minutes of Exercise per Session: Not on file  Stress:   . Feeling of Stress : Not on file  Social Connections:   . Frequency of Communication with Friends and Family: Not on file  . Frequency of Social Gatherings with Friends and Family: Not on file  . Attends Religious Services: Not on file  . Active Member of Clubs or Organizations: Not on file  . Attends Archivist Meetings: Not on file  . Marital Status: Not on file   Allergies  Allergen Reactions  . Sulfa Antibiotics Rash  . Sulfonamide Derivatives Rash   Family History  Problem Relation Age of Onset  . Cancer Mother   . Hypertension Father   . Alcohol abuse Father   . Heart disease Father        Current Outpatient Medications (Analgesics):  .  meloxicam (MOBIC) 7.5 MG  tablet, Take 1 tablet (7.5 mg total) by mouth daily.   Current Outpatient Medications (Other):  .  phentermine (ADIPEX-P) 37.5 MG tablet, Take 1 tablet (37.5 mg total) by mouth daily before breakfast. .  traZODone (DESYREL) 50 MG tablet, Take 0.5-1 tablets (25-50 mg total) by mouth at bedtime as needed for sleep. Marland Kitchen  tiZANidine (ZANAFLEX) 2 MG tablet, Take 1 tablet (2 mg total) by mouth at bedtime.   Reviewed prior external information including notes and imaging from  primary care provider As well as notes that were available from care everywhere and other healthcare systems.  Past medical history, social, surgical and family history all reviewed in electronic medical record.  No  pertanent information unless stated regarding to the chief complaint.   Review of Systems:  No headache, visual changes, nausea, vomiting, diarrhea, constipation, dizziness, abdominal pain, skin rash, fevers, chills, night sweats, weight loss,, body aches, joint swelling, chest pain, shortness of breath, mood changes. POSITIVE muscle aches  Objective  Blood pressure 120/82, pulse 75, height 5\' 2"  (1.575 m), weight 168 lb (76.2 kg), SpO2 98 %, unknown if currently breastfeeding.   General: No apparent distress alert and oriented x3 mood and affect normal, dressed appropriately.  Appears to be moderately uncomfortable. HEENT: Pupils equal, extraocular movements intact  Respiratory: Patient's speak in full sentences and does not appear short of breath  Cardiovascular: No lower extremity edema, non tender, no erythema  Neuro: Cranial nerves II through XII are intact, neurovascularly intact in all extremities with 2+ DTRs and 2+ pulses.   Gait normal with good balance and coordination.  MSK:  Non tender with full range of motion and good stability and symmetric strength and tone of shoulders, elbows, wrist, hip, knee and ankles bilaterally.  Neck exam does show that patient does have some posterior cervical lymphadenopathy and anterior lymphadenopathy right greater than left.  Patient is tender to palpation in this area. Patient's neck does have tightness.  Multiple trigger points noted in the right shoulder region in the rhomboid, trapezius and latissimus dorsi.  Negative Spurling's but patient is very tender.  After verbal consent patient was prepped with alcohol swabs and with a 25-gauge half inch needle injected into 3 distinct trigger points 1 in the rhomboid, 1 in the trapezius and 1 in the latissimus dorsi at the right shoulder region.  Total of 3 cc of 0.5% Marcaine and 1 cc of Kenalog 40 mg/mL used.  Minimal blood loss.  2 Band-Aids placed.  Postinjection instructions given   Impression and  Recommendations:     The above documentation has been reviewed and is accurate and complete Lyndal Pulley, DO

## 2020-01-05 ENCOUNTER — Encounter: Payer: Self-pay | Admitting: Family Medicine

## 2020-01-05 ENCOUNTER — Other Ambulatory Visit: Payer: Self-pay

## 2020-01-05 ENCOUNTER — Other Ambulatory Visit (HOSPITAL_COMMUNITY): Payer: Self-pay | Admitting: Family Medicine

## 2020-01-05 ENCOUNTER — Ambulatory Visit: Payer: 59 | Admitting: Family Medicine

## 2020-01-05 DIAGNOSIS — M25511 Pain in right shoulder: Secondary | ICD-10-CM | POA: Diagnosis not present

## 2020-01-05 DIAGNOSIS — M5412 Radiculopathy, cervical region: Secondary | ICD-10-CM | POA: Diagnosis not present

## 2020-01-05 HISTORY — DX: Pain in right shoulder: M25.511

## 2020-01-05 MED ORDER — TIZANIDINE HCL 2 MG PO TABS: 2.0000 mg | ORAL_TABLET | Freq: Every day | ORAL | 0 refills | Status: DC

## 2020-01-05 MED ORDER — MELOXICAM 7.5 MG PO TABS: 7.5000 mg | ORAL_TABLET | Freq: Every day | ORAL | 0 refills | Status: DC

## 2020-01-05 MED FILL — tiZANidine HCL 2 MG TABS: 2 | 30 days supply | Qty: 30 | Fill #0

## 2020-01-05 MED FILL — MELOXICAM 7.5 MG TABLET: 7.5 | 30 days supply | Qty: 30 | Fill #0

## 2020-01-05 NOTE — Patient Instructions (Addendum)
Good to see you Scapular exercises Meloxicam 7.5 10 days then as needed  Zanaflex 2 mg as needed at night See me again in 3 weeks if not perfect

## 2020-01-05 NOTE — Assessment & Plan Note (Signed)
Trigger point injection given today.  These were in the rhomboid, trapezius and the latissimus dorsi.  Patient responded well.  I do believe that patient does have some mild lymphadenopathy that is also contributing.  We will hold on any type of neck x-ray at this time.  We discussed with patient about anti-inflammatories including meloxicam and given a very low dose of Zanaflex to take at night.  Encourage patient to increase activity slowly.  Patient will call on Monday if no significant improvement.  Follow-up with me again in 2 to 3 weeks

## 2020-01-05 NOTE — Assessment & Plan Note (Signed)
History of a disc bulge previously and we will continue to monitor if any radicular symptoms occurs.

## 2020-01-23 ENCOUNTER — Ambulatory Visit (INDEPENDENT_AMBULATORY_CARE_PROVIDER_SITE_OTHER): Payer: 59 | Admitting: Family Medicine

## 2020-01-23 ENCOUNTER — Encounter: Payer: Self-pay | Admitting: Family Medicine

## 2020-01-23 ENCOUNTER — Other Ambulatory Visit: Payer: Self-pay

## 2020-01-23 DIAGNOSIS — M5412 Radiculopathy, cervical region: Secondary | ICD-10-CM | POA: Diagnosis not present

## 2020-01-23 NOTE — Assessment & Plan Note (Signed)
Likely patient did not have any significant radiculopathy today.  Is making progress.  Patient wanted to take for 10 days meloxicam and can take it again if necessary.  Patient does have the Zanaflex for nighttime pain.  Encourage patient to continue to be active.  Will raise handlebars a little bit.  Follow-up with me again in 4 to 8 weeks

## 2020-01-23 NOTE — Progress Notes (Signed)
[facility] [address] [place] Phone: [phone] Subjective:   I Kelsey [name] am serving as a Education administrator for Dr. Hulan Saas.  This visit occurred during the SARS-CoV-2 public health emergency.  Safety protocols were in place, including screening questions prior to the visit, additional usage of staff PPE, and extensive cleaning of exam room while observing appropriate contact time as indicated for disinfecting solutions.   I'm seeing this patient by the request  of:  Inda Coke, [place]  CC: neck pain follow up   [id number]   01/05/2020 Trigger point injection given today.  These were in the rhomboid, trapezius and the latissimus dorsi.  Patient responded well.  I do believe that patient does have some mild lymphadenopathy that is also contributing.  We will hold on any type of neck x-ray at this time.  We discussed with patient about anti-inflammatories including meloxicam and given a very low dose of Zanaflex to take at night.  Encourage patient to increase activity slowly.  Patient will call on Monday if no significant improvement.  Follow-up with me again in 2 to 3 weeks   Update 01/23/2020 Kelsey [name] is a 39 y.o. female coming in with complaint of neck and shoulder pain. Patient states she is doing very well. Occasionally pain. After 4 days of her last visit she states she is much better. Lymph nodes still slightly swollen.  Patient feels like she is making progress.  Patient denies any of the radicular symptoms as much.  Feels like the right shoulder is not as tight.      Past Medical History:  Diagnosis Date  . Headache(784.0)   . Overweight (BMI 25.0-29.9) 01/23/2018  . PONV (postoperative nausea and vomiting)   . Pregnancy induced hypertension    Past Surgical History:  Procedure Laterality Date  . CESAREAN SECTION  2008, 2013  . CHOLECYSTECTOMY  12/2013  . DILATION AND EVACUATION  10/04/2010   Procedure:  DILATATION AND EVACUATION (D&E);  Surgeon: Marylynn Pearson;  Location: [facility];  Service: Gynecology;  Laterality: N/A;  pt last ate at 8:30am  . WISDOM TOOTH EXTRACTION  2006   Social History   Socioeconomic History  . Marital status: Married    Spouse name: Not on file  . Number of children: Not on file  . Years of education: Not on file  . Highest education level: Not on file  Occupational History  . Not on file  Tobacco Use  . Smoking status: Never Smoker  . Smokeless tobacco: Never Used  Substance and Sexual Activity  . Alcohol use: No  . Drug use: No  . Sexual activity: Yes  Other Topics Concern  . Not on file  Social History Narrative  . Not on file   Social Determinants of Health   Financial Resource Strain:   . Difficulty of Paying Living Expenses: Not on file  Food Insecurity:   . Worried About Charity fundraiser in the Last Year: Not on file  . Ran Out of Food in the Last Year: Not on file  Transportation Needs:   . Lack of Transportation (Medical): Not on file  . Lack of Transportation (Non-Medical): Not on file  Physical Activity:   . Days of Exercise per Week: Not on file  . Minutes of Exercise per Session: Not on file  Stress:   . Feeling of Stress : Not on file  Social Connections:   . Frequency of Communication with Friends  and Family: Not on file  . Frequency of Social Gatherings with Friends and Family: Not on file  . Attends Religious Services: Not on file  . Active Member of Clubs or Organizations: Not on file  . Attends Archivist Meetings: Not on file  . Marital Status: Not on file   Allergies  Allergen Reactions  . Sulfa Antibiotics Rash  . Sulfonamide Derivatives Rash   Family History  Problem Relation Age of Onset  . Cancer Mother   . Hypertension Father   . Alcohol abuse Father   . Heart disease Father        Current Outpatient Medications (Analgesics):  .  meloxicam (MOBIC) 7.5 MG tablet, Take 1 tablet (7.5 mg  total) by mouth daily.   Current Outpatient Medications (Other):  .  phentermine (ADIPEX-P) 37.5 MG tablet, Take 1 tablet (37.5 mg total) by mouth daily before breakfast. .  tiZANidine (ZANAFLEX) 2 MG tablet, Take 1 tablet (2 mg total) by mouth at bedtime. .  traZODone (DESYREL) 50 MG tablet, Take 0.5-1 tablets (25-50 mg total) by mouth at bedtime as needed for sleep.   Reviewed prior external information including notes and imaging from  primary care provider As well as notes that were available from care everywhere and other healthcare systems.  Past medical history, social, surgical and family history all reviewed in electronic medical record.  No pertanent information unless stated regarding to the chief complaint.   Review of Systems:  No headache, visual changes, nausea, vomiting, diarrhea, constipation, dizziness, abdominal pain, skin rash, fevers, chills, night sweats, weight loss, swollen lymph nodes, body aches, joint swelling, chest pain, shortness of breath, mood changes. POSITIVE muscle aches  Objective  Blood pressure 130/80, pulse 75, height 5\' 2"  (1.575 m), SpO2 98 %, unknown if currently breastfeeding.   General: No apparent distress alert and oriented x3 mood and affect normal, dressed appropriately.  HEENT: Pupils equal, extraocular movements intact  Respiratory: Patient's speak in full sentences and does not appear short of breath  Cardiovascular: No lower extremity edema, non tender, no erythema  Neuro: Cranial nerves II through XII are intact, neurovascularly intact in all extremities with 2+ DTRs and 2+ pulses.  Gait normal with good balance and coordination.  MSK:  Non tender with full range of motion and good stability and symmetric strength and tone of shoulders, elbows, wrist, hip, knee and ankles bilaterally.  Neck exam shows some very mild loss of lordosis but significant improvement in range of motion.  Do not feel significant lymph nodes and margins are  shotty lymph nodes at the moment.  Negative Spurling's which is an improvement.  5 out of 5 strength of the upper extremities.   Impression and Recommendations:     The above documentation has been reviewed and is accurate and complete Lyndal Pulley, DO

## 2020-01-23 NOTE — Patient Instructions (Signed)
Keep up with the exercises You should be good by [place] day See me again in 4-6 weeks

## 2020-02-21 ENCOUNTER — Ambulatory Visit: Payer: 59 | Admitting: Family Medicine

## 2020-02-21 ENCOUNTER — Encounter: Payer: Self-pay | Admitting: Family Medicine

## 2020-02-21 ENCOUNTER — Other Ambulatory Visit: Payer: Self-pay

## 2020-02-21 VITALS — BP 110/80 | HR 86 | Ht 62.0 in | Wt 168.0 lb

## 2020-02-21 DIAGNOSIS — M5412 Radiculopathy, cervical region: Secondary | ICD-10-CM

## 2020-02-21 DIAGNOSIS — R293 Abnormal posture: Secondary | ICD-10-CM | POA: Diagnosis not present

## 2020-02-21 DIAGNOSIS — M999 Biomechanical lesion, unspecified: Secondary | ICD-10-CM | POA: Insufficient documentation

## 2020-02-21 HISTORY — DX: Biomechanical lesion, unspecified: M99.9

## 2020-02-21 HISTORY — DX: Abnormal posture: R29.3

## 2020-02-21 NOTE — Patient Instructions (Signed)
Have fun in [place] to work on posture See me again in 2 months

## 2020-02-21 NOTE — Assessment & Plan Note (Addendum)
   Decision today to treat with OMT was based on Physical Exam  After verbal consent patient was treated with HVLA, ME, FPR techniques in cervical, thoracic, rib areas, all areas are chronic   Patient tolerated the procedure well with improvement in symptoms  Patient given exercises, stretches and lifestyle modifications  See medications in patient instructions if given  Patient will follow up in 8 weeks

## 2020-02-21 NOTE — Progress Notes (Signed)
[name] Phone: [phone] Subjective:   Kelsey [name], am serving as a scribe for Dr. Hulan Saas. This visit occurred during the SARS-CoV-2 public health emergency.  Safety protocols were in place, including screening questions prior to the visit, additional usage of staff PPE, and extensive cleaning of exam room while observing appropriate contact time as indicated for disinfecting solutions.   I'm seeing this patient by the request  of:  Kelsey [name], [place]  CC: neck pain follow up   [id number]   01/23/2020 Likely patient did not have any significant radiculopathy today.  Is making progress.  Patient wanted to take for 10 days meloxicam and can take it again if necessary.  Patient does have the Zanaflex for nighttime pain.  Encourage patient to continue to be active.  Will raise handlebars a little bit.  Follow-up with me again in 4 to 8 weeks  Update 02/21/2020 Kelsey [name] is a 39 y.o. female coming in with complaint of back and neck pain. Patient states continues to make improvement. Is making improvement. [name] radiation tightness noted when sitting at computer.   Medications patient has been prescribed: meloxicam zanaflex   Taking: intermittently          Reviewed prior external information including notes and imaging from previsou exam, outside providers and external EMR if available.   As well as notes that were available from care everywhere and other healthcare systems.  Past medical history, social, surgical and family history all reviewed in electronic medical record.  [name] pertanent information unless stated regarding to the chief complaint.   Past Medical History:  Diagnosis Date  . Headache(784.0)   . Overweight (BMI 25.0-29.9) 01/23/2018  . PONV (postoperative nausea and vomiting)   . Pregnancy induced hypertension     Allergies  Allergen Reactions  . Sulfa Antibiotics  Rash  . Sulfonamide Derivatives Rash     Review of Systems:  [name] headache, visual changes, nausea, vomiting, diarrhea, constipation, dizziness, abdominal pain, skin rash, fevers, chills, night sweats, weight loss, swollen lymph nodes, body aches, joint swelling, chest pain, shortness of breath, mood changes. POSITIVE muscle aches mild   Objective  Blood pressure 110/80, pulse 86, height 5\' 2"  (1.575 m), weight 168 lb (76.2 kg), SpO2 99 %, unknown if currently breastfeeding.   General: [name] apparent distress alert and oriented x3 mood and affect normal, dressed appropriately.  HEENT: Pupils equal, extraocular movements intact  Respiratory: Patient's speak in full sentences and does not appear short of breath  Cardiovascular: [name] lower extremity edema, non tender, [name] erythema  Gait normal with good balance and coordination.  MSK:  Non tender with full range of motion and good stability and symmetric strength and tone of shoulders, elbows, wrist, hip, knee and ankles bilaterally.  Neck exam does have some loss of lordosis neg spurlings  Tightness of trapezius bilaterally. Mild tightness of right scapular region. 5/5 strength of upper extremities  Osteopathic findings   C6 flexed rotated and side bent left T3 extended rotated and side bent right inhaled rib T9 extended rotated and side bent left        Assessment and Plan:        The above documentation has been reviewed and is accurate and complete Kelsey Pulley, DO       Note: This dictation was prepared with Dragon dictation along with smaller phrase technology. Any transcriptional errors that result from this process are  unintentional.

## 2020-02-21 NOTE — Assessment & Plan Note (Signed)
Stable at the moment.  No significant radicular symptoms.  As stated before continue to work on exercises.  No change in medications.  Follow-up with me again in 2 months

## 2020-02-21 NOTE — Assessment & Plan Note (Signed)
Patient is having more of a small amount of poor posture.  We discussed posture and ergonomics.  Patient does have the Zanaflex for breakthrough.  Patient does have more the meloxicam if necessary.  Patient does respond fairly well to osteopathic manipulation.  Follow-up with me again in 2 months

## 2020-03-29 ENCOUNTER — Other Ambulatory Visit: Payer: 59

## 2020-04-02 ENCOUNTER — Other Ambulatory Visit: Payer: 59

## 2020-04-19 NOTE — Progress Notes (Signed)
[facility] [address] [place] Phone: [phone] Subjective:   I Kelsey [name] am serving as a Education administrator for Dr. Hulan Saas.  This visit occurred during the SARS-CoV-2 public health emergency.  Safety protocols were in place, including screening questions prior to the visit, additional usage of staff PPE, and extensive cleaning of exam room while observing appropriate contact time as indicated for disinfecting solutions.   I'm seeing this patient by the request  of:  Kelsey [name], [place]  CC: Back and neck pain follow-up  [id number]  Kelsey [name] is a 40 y.o. female coming in with complaint of back and neck pain. OMt 02/21/2020. Patient states she is doing well today.  Very minimal discomfort.  No radiation of the pain.  Continues to make some progress though.  Medications patient has been prescribed: Meloxicam          Reviewed prior external information including notes and imaging from previsou exam, outside providers and external EMR if available.   As well as notes that were available from care everywhere and other healthcare systems.  Past medical history, social, surgical and family history all reviewed in electronic medical record.  No pertanent information unless stated regarding to the chief complaint.   Past Medical History:  Diagnosis Date  . Headache(784.0)   . Overweight (BMI 25.0-29.9) 01/23/2018  . PONV (postoperative nausea and vomiting)   . Pregnancy induced hypertension     Allergies  Allergen Reactions  . Sulfa Antibiotics Rash  . Sulfonamide Derivatives Rash     Review of Systems:  No headache, visual changes, nausea, vomiting, diarrhea, constipation, dizziness, abdominal pain, skin rash, fevers, chills, night sweats, weight loss, swollen lymph nodes, body aches, joint swelling, chest pain, shortness of breath, mood changes. POSITIVE muscle aches  Objective  Blood pressure 120/90, pulse  83, height 5\' 2"  (1.575 m), SpO2 100 %, unknown if currently breastfeeding.   General: No apparent distress alert and oriented x3 mood and affect normal, dressed appropriately.  HEENT: Pupils equal, extraocular movements intact  Respiratory: Patient's speak in full sentences and does not appear short of breath  Cardiovascular: No lower extremity edema, non tender, no erythema  Neuro: Cranial nerves II through XII are intact, neurovascularly intact in all extremities with 2+ DTRs and 2+ pulses.  Gait normal with good balance and coordination.  MSK:  Non tender with full range of motion and good stability and symmetric strength and tone of shoulders, elbows, wrist, hip, knee and ankles bilaterally.  Neck exam does have some very mild loss of lordosis.  Negative Spurling's.  Tightness on the right side of the neck compared to left.  Tightness from the C4 through the T6 on the right side  Osteopathic findings  C4 flexed rotated and side bent right C6 flexed rotated and side bent left T6 extended rotated and side bent right inhaled rib       Assessment and Plan:  Cervical radiculopathy at C6 Patient is making significant progress.  No radicular symptoms.  Does have the muscle relaxer if needed.  He does have the meloxicam as well but has made good progress.  Responds well to manipulation.  Follow-up again in 2 to 3 months     Nonallopathic problems  Decision today to treat with OMT was based on Physical Exam  After verbal consent patient was treated with HVLA, ME, FPR techniques in cervical, rib, thoracic areas  Patient tolerated the procedure well with improvement in  symptoms  Patient given exercises, stretches and lifestyle modifications  See medications in patient instructions if given  Patient will follow up in 4-8 weeks      The above documentation has been reviewed and is accurate and complete Lyndal Pulley, DO       Note: This dictation was prepared with Dragon  dictation along with smaller phrase technology. Any transcriptional errors that result from this process are unintentional.

## 2020-04-24 ENCOUNTER — Other Ambulatory Visit: Payer: Self-pay

## 2020-04-24 ENCOUNTER — Encounter: Payer: Self-pay | Admitting: Family Medicine

## 2020-04-24 ENCOUNTER — Ambulatory Visit: Payer: 59 | Admitting: Family Medicine

## 2020-04-24 VITALS — BP 120/90 | HR 83 | Ht 62.0 in

## 2020-04-24 DIAGNOSIS — M999 Biomechanical lesion, unspecified: Secondary | ICD-10-CM

## 2020-04-24 DIAGNOSIS — M5412 Radiculopathy, cervical region: Secondary | ICD-10-CM

## 2020-04-24 NOTE — Patient Instructions (Addendum)
Good to see you Great to see you Keep getting better and better See me again in 2 months

## 2020-04-24 NOTE — Assessment & Plan Note (Signed)
Patient is making significant progress.  No radicular symptoms.  Does have the muscle relaxer if needed.  He does have the meloxicam as well but has made good progress.  Responds well to manipulation.  Follow-up again in 2 to 3 months

## 2020-05-11 ENCOUNTER — Other Ambulatory Visit: Payer: Self-pay | Admitting: Nurse Practitioner

## 2020-05-11 DIAGNOSIS — Z1231 Encounter for screening mammogram for malignant neoplasm of breast: Secondary | ICD-10-CM

## 2020-05-14 ENCOUNTER — Ambulatory Visit (INDEPENDENT_AMBULATORY_CARE_PROVIDER_SITE_OTHER): Payer: 59 | Admitting: Nurse Practitioner

## 2020-05-14 ENCOUNTER — Other Ambulatory Visit (HOSPITAL_COMMUNITY): Payer: Self-pay | Admitting: Nurse Practitioner

## 2020-05-14 ENCOUNTER — Other Ambulatory Visit: Payer: Self-pay

## 2020-05-14 ENCOUNTER — Encounter (HOSPITAL_BASED_OUTPATIENT_CLINIC_OR_DEPARTMENT_OTHER): Payer: Self-pay | Admitting: Nurse Practitioner

## 2020-05-14 ENCOUNTER — Other Ambulatory Visit (HOSPITAL_BASED_OUTPATIENT_CLINIC_OR_DEPARTMENT_OTHER)
Admission: RE | Admit: 2020-05-14 | Discharge: 2020-05-14 | Disposition: A | Discharge status: Home / Self Care | Payer: 59 | Source: Ambulatory Visit | Attending: Nurse Practitioner | Admitting: Nurse Practitioner

## 2020-05-14 VITALS — BP 118/76 | HR 83 | Ht 61.0 in | Wt 163.8 lb

## 2020-05-14 DIAGNOSIS — Z6832 Body mass index (BMI) 32.0-32.9, adult: Secondary | ICD-10-CM | POA: Insufficient documentation

## 2020-05-14 DIAGNOSIS — E559 Vitamin D deficiency, unspecified: Secondary | ICD-10-CM

## 2020-05-14 DIAGNOSIS — Z7689 Persons encountering health services in other specified circumstances: Secondary | ICD-10-CM

## 2020-05-14 DIAGNOSIS — F5101 Primary insomnia: Secondary | ICD-10-CM | POA: Insufficient documentation

## 2020-05-14 DIAGNOSIS — Z6834 Body mass index (BMI) 34.0-34.9, adult: Secondary | ICD-10-CM | POA: Insufficient documentation

## 2020-05-14 DIAGNOSIS — R638 Other symptoms and signs concerning food and fluid intake: Secondary | ICD-10-CM | POA: Diagnosis not present

## 2020-05-14 DIAGNOSIS — Z6830 Body mass index (BMI) 30.0-30.9, adult: Secondary | ICD-10-CM | POA: Diagnosis not present

## 2020-05-14 DIAGNOSIS — Z0000 Encounter for general adult medical examination without abnormal findings: Secondary | ICD-10-CM | POA: Diagnosis not present

## 2020-05-14 LAB — CBC WITH DIFFERENTIAL/PLATELET
Abs Immature Granulocytes: 0.02 10*3/uL (ref 0.00–0.07)
Basophils Absolute: 0.1 10*3/uL (ref 0.0–0.1)
Basophils Relative: 1 %
Eosinophils Absolute: 0.2 10*3/uL (ref 0.0–0.5)
Eosinophils Relative: 4 %
HCT: 41.7 % (ref 36.0–46.0)
Hemoglobin: 14.1 g/dL (ref 12.0–15.0)
Immature Granulocytes: 0 %
Lymphocytes Relative: 31 %
Lymphs Abs: 2 10*3/uL (ref 0.7–4.0)
MCH: 30.5 pg (ref 26.0–34.0)
MCHC: 33.8 g/dL (ref 30.0–36.0)
MCV: 90.1 fL (ref 80.0–100.0)
Monocytes Absolute: 0.5 10*3/uL (ref 0.1–1.0)
Monocytes Relative: 7 %
Neutro Abs: 3.6 10*3/uL (ref 1.7–7.7)
Neutrophils Relative %: 56 %
Platelets: 361 10*3/uL (ref 150–400)
RBC: 4.63 MIL/uL (ref 3.87–5.11)
RDW: 13.2 % (ref 11.5–15.5)
WBC: 6.5 10*3/uL (ref 4.0–10.5)
nRBC: 0 % (ref 0.0–0.2)

## 2020-05-14 LAB — COMPREHENSIVE METABOLIC PANEL
ALT: 16 U/L (ref 0–44)
AST: 17 U/L (ref 15–41)
Albumin: 4.7 g/dL (ref 3.5–5.0)
Alkaline Phosphatase: 67 U/L (ref 38–126)
Anion gap: 9 (ref 5–15)
BUN: 11 mg/dL (ref 6–20)
CO2: 27 mmol/L (ref 22–32)
Calcium: 9.6 mg/dL (ref 8.9–10.3)
Chloride: 103 mmol/L (ref 98–111)
Creatinine, Ser: 0.83 mg/dL (ref 0.44–1.00)
GFR, Estimated: >60 mL/min (ref >60)
Glucose, Bld: 80 mg/dL (ref 70–99)
Potassium: 4.1 mmol/L (ref 3.5–5.1)
Sodium: 139 mmol/L (ref 135–145)
Total Bilirubin: 0.6 mg/dL (ref 0.3–1.2)
Total Protein: 7.6 g/dL (ref 6.5–8.1)

## 2020-05-14 LAB — LIPID PANEL
Cholesterol: 229 mg/dL — ABNORMAL HIGH (ref 0–200)
HDL: 78 mg/dL (ref >40)
LDL Cholesterol: 130 mg/dL — ABNORMAL HIGH (ref 0–99)
Total CHOL/HDL Ratio: 2.9 RATIO
Triglycerides: 106 mg/dL (ref <150)
VLDL: 21 mg/dL (ref 0–40)

## 2020-05-14 LAB — VITAMIN D 25 HYDROXY (VIT D DEFICIENCY, FRACTURES): Vit D, 25-Hydroxy: 31.24 ng/mL (ref 30–100)

## 2020-05-14 MED ORDER — TRAZODONE HCL 50 MG PO TABS: 25.0000 mg | ORAL_TABLET | Freq: Every evening | ORAL | 2 refills | Status: DC | PRN

## 2020-05-14 MED FILL — traZODone HCL 50 MG TABS: 50 | 90 days supply | Qty: 90 | Fill #0

## 2020-05-14 NOTE — Assessment & Plan Note (Signed)
Problems with staying asleep throughout the night, Kelsey [name] morning awakening. She has had success with low dose trazodone use in the past, but stopped taking when the prescription ran out.  Discussed the option to restart this medication vs. try alternative agent such as hydroxyzine. Joint decision made to restart trazodone given her successful response in the past.  Recommend follow-up if side effects develop with medication or new symptoms arise.

## 2020-05-14 NOTE — Progress Notes (Signed)
BP 118/76   Pulse 83   Ht 5\' 1"  (1.549 m)   Wt 163 lb 12.8 oz (74.3 kg)   SpO2 99%   BMI 30.95 kg/m    Subjective:    Patient ID: Kelsey [name], female    DOB: 03/07/1981, 40 y.o.   MRN: [id number]  HPI: Kelsey [name] is a 40 y.o. female presenting on 05/14/2020 to establish care and for comprehensive medical examination. Current medical complaints include:none  She currently lives with:spouse Interim Problems from her last visit: Cervical spine radiculopathy- seen by orthopedics for this. Difficulty staying asleep- previous use of trazodone with benefit.    She reports regular vision exams q1-5y: yes She reports regular dental exams q 32m: yes Her diet consists of: Regular, overall healthy with reported need to increase vegitable and water consumption She endorses exercise and/or activity of: no regular routine She works at: [employer]  She denies ETOH use- possible 2 drinks per year She denies nictoine use  She denies illegal substance use   She reports regular menstrual periods with normal flow. Current menopausal symptoms: no She is currently sexually active with one partners. She denies concerns today about STI Contraception choices are: tubal ligation  She denies concerns about skin changes today: She is seen by dermatology for annual skin evaluation. She denies concerns about bowel changes today She denies concerns about bladder changes today  Depression Screen done today and results listed below:  Depression screen [facility] 2/9 04/27/2018 08/24/2017 03/11/2016 02/12/2016 01/28/2016  Decreased Interest 0 0 0 0 0  Down, Depressed, Hopeless 0 0 0 0 0  PHQ - 2 Score 0 0 0 0 0  Altered sleeping 0 - - - -  Tired, decreased energy 0 - - - -  Change in appetite 0 - - - -  Feeling bad or failure about yourself  0 - - - -  Trouble concentrating 0 - - - -  Moving slowly or fidgety/restless 0 - - - -  Suicidal thoughts 0 - - - -  PHQ-9 Score 0 - - - -    She does not  have a history of falls. I did not complete a risk assessment for falls. A plan of care for falls was not documented.   Past Medical History:  Past Medical History:  Diagnosis Date  . Headache(784.0)   . Overweight (BMI 25.0-29.9) 01/23/2018  . PONV (postoperative nausea and vomiting)   . Pregnancy induced hypertension     Surgical History:  Past Surgical History:  Procedure Laterality Date  . CESAREAN SECTION  2008, 2013  . CHOLECYSTECTOMY  12/2013  . DILATION AND EVACUATION  10/04/2010   Procedure: DILATATION AND EVACUATION (D&E);  Surgeon: Marylynn Pearson;  Location: [facility];  Service: Gynecology;  Laterality: N/A;  pt last ate at 8:30am  . WISDOM TOOTH EXTRACTION  2006    Medications:  No current outpatient medications on file prior to visit.   No current facility-administered medications on file prior to visit.    Allergies:  Allergies  Allergen Reactions  . Sulfa Antibiotics Rash  . Sulfonamide Derivatives Rash    Social History:  Social History   Socioeconomic History  . Marital status: Married    Spouse name: Not on file  . Number of children: Not on file  . Years of education: Not on file  . Highest education level: Not on file  Occupational History  . Not on file  Tobacco Use  . Smoking status:  Never Smoker  . Smokeless tobacco: Never Used  Substance and Sexual Activity  . Alcohol use: No  . Drug use: No  . Sexual activity: Yes  Other Topics Concern  . Not on file  Social History Narrative  . Not on file   Social Determinants of Health   Financial Resource Strain: Not on file  Food Insecurity: Not on file  Transportation Needs: Not on file  Physical Activity: Not on file  Stress: Not on file  Social Connections: Not on file  Intimate Partner Violence: Not on file   Social History   Tobacco Use  Smoking Status Never Smoker  Smokeless Tobacco Never Used   Social History   Substance and Sexual Activity  Alcohol Use No    Family  History:  Family History  Problem Relation Age of Onset  . Cancer Mother   . Hypertension Father   . Alcohol abuse Father   . Heart disease Father     Past medical history, surgical history, medications, allergies, family history and social history reviewed with patient today and changes made to appropriate areas of the chart.   All ROS negative except what is listed above and in the HPI.      Objective:    BP 118/76   Pulse 83   Ht 5\' 1"  (1.549 m)   Wt 163 lb 12.8 oz (74.3 kg)   SpO2 99%   BMI 30.95 kg/m   Wt Readings from Last 3 Encounters:  05/14/20 163 lb 12.8 oz (74.3 kg)  02/21/20 168 lb (76.2 kg)  01/05/20 168 lb (76.2 kg)    Physical Exam Vitals and nursing note reviewed.  Constitutional:      Appearance: Normal appearance.  HENT:     Head: Normocephalic and atraumatic.     Right Ear: Tympanic membrane, ear canal and external ear normal.     Left Ear: Tympanic membrane, ear canal and external ear normal.     Nose: Nose normal. No congestion.     Mouth/Throat:     Mouth: Mucous membranes are moist.     Pharynx: Oropharynx is clear.  Eyes:     Pupils: Pupils are equal, round, and reactive to light.  Cardiovascular:     Rate and Rhythm: Normal rate and regular rhythm.     Pulses: Normal pulses.     Heart sounds: Normal heart sounds. No murmur heard.   Pulmonary:     Effort: Pulmonary effort is normal.     Breath sounds: Normal breath sounds.  Abdominal:     General: Abdomen is flat. Bowel sounds are normal. There is no distension.     Palpations: Abdomen is soft.     Tenderness: There is no abdominal tenderness. There is no right CVA tenderness, left CVA tenderness or rebound.  Musculoskeletal:        General: No tenderness. Normal range of motion.     Cervical back: Normal range of motion and neck supple. No rigidity or tenderness.     Right lower leg: No edema.     Left lower leg: No edema.  Lymphadenopathy:     Cervical: No cervical adenopathy.   Skin:    General: Skin is warm and dry.     Capillary Refill: Capillary refill takes less than 2 seconds.  Neurological:     General: No focal deficit present.     Mental Status: She is alert and oriented to person, place, and time.     Cranial Nerves: No  cranial nerve deficit.     Motor: No weakness.     Coordination: Coordination normal.     Gait: Gait normal.  Psychiatric:        Mood and Affect: Mood normal.        Behavior: Behavior normal.        Thought Content: Thought content normal.        Judgment: Judgment normal.     Results for orders placed or performed in visit on 05/14/20  Comprehensive metabolic panel  Result Value Ref Range   Sodium 139 135 - 145 mmol/L   Potassium 4.1 3.5 - 5.1 mmol/L   Chloride 103 98 - 111 mmol/L   CO2 27 22 - 32 mmol/L   Glucose, Bld 80 70 - 99 mg/dL   BUN 11 6 - 20 mg/dL   Creatinine, Ser 0.83 0.44 - 1.00 mg/dL   Calcium 9.6 8.9 - 10.3 mg/dL   Total Protein 7.6 6.5 - 8.1 g/dL   Albumin 4.7 3.5 - 5.0 g/dL   AST 17 15 - 41 U/L   ALT 16 0 - 44 U/L   Alkaline Phosphatase 67 38 - 126 U/L   Total Bilirubin 0.6 0.3 - 1.2 mg/dL   GFR, Estimated >60 >60 mL/min   Anion gap 9 5 - 15  CBC with Differential/Platelet  Result Value Ref Range   WBC 6.5 4.0 - 10.5 K/uL   RBC 4.63 3.87 - 5.11 MIL/uL   Hemoglobin 14.1 12.0 - 15.0 g/dL   HCT 41.7 36.0 - 46.0 %   MCV 90.1 80.0 - 100.0 fL   MCH 30.5 26.0 - 34.0 pg   MCHC 33.8 30.0 - 36.0 g/dL   RDW 13.2 11.5 - 15.5 %   Platelets 361 150 - 400 K/uL   nRBC 0.0 0.0 - 0.2 %   Neutrophils Relative % 56 %   Neutro Abs 3.6 1.7 - 7.7 K/uL   Lymphocytes Relative 31 %   Lymphs Abs 2.0 0.7 - 4.0 K/uL   Monocytes Relative 7 %   Monocytes Absolute 0.5 0.1 - 1.0 K/uL   Eosinophils Relative 4 %   Eosinophils Absolute 0.2 0.0 - 0.5 K/uL   Basophils Relative 1 %   Basophils Absolute 0.1 0.0 - 0.1 K/uL   Immature Granulocytes 0 %   Abs Immature Granulocytes 0.02 0.00 - 0.07 K/uL      Assessment &  Plan:   Problem List Items Addressed This Visit      Other   Vitamin D deficiency    History of vitamin d deficiency. It has been a while since she has had labs. Will obtain vitamin D labs today.       Relevant Orders   Vitamin D (25 hydroxy)   Difficulty maintaining weight    Working on weight loss with changes to diet and exercise. She has increased water consumption and is working on monitoring her intake and making long term changes to her habits. She has lost 10 lbs since the start of the new year.  Patient praised for excellent success and changes consistent with long term weight management.  Will follow along and may consider medication management in the future if her current regimen stalls.       Encounter for annual physical exam    Annual physical exam with no concerning findings on evaluation.  Will obtain lab work and make changes to plan of care if needed based on results.  Plan to follow-up in 1 year  for annual exam or sooner if needed.       Primary insomnia    Problems with staying asleep throughout the night, Andie Mungin morning awakening. She has had success with low dose trazodone use in the past, but stopped taking when the prescription ran out.  Discussed the option to restart this medication vs. try alternative agent such as hydroxyzine. Joint decision made to restart trazodone given her successful response in the past.  Recommend follow-up if side effects develop with medication or new symptoms arise.       Relevant Medications   traZODone (DESYREL) 50 MG tablet   Encounter to establish care with new doctor - Primary    Establish care with new PCP due to transfer of old PCP to a new specialty.       Relevant Orders   Comprehensive metabolic panel (Completed)   Lipid panel   CBC with Differential/Platelet (Completed)   Vitamin D (25 hydroxy)   Body mass index (BMI) of 30.0-30.9 in adult    See A&P for difficulty maintaining weight for full plan.            Follow up plan: Return in about 1 year (around 05/14/2021) for CPE or sooner if needed.   LABORATORY TESTING:  - Pap smear: appointment next week - STI testing: deferred  IMMUNIZATIONS:   - Tdap: Tetanus vaccination status reviewed: last tetanus booster within 10 years. - Influenza: Up to date - Pneumovax: Not applicable - Prevnar: Not applicable - HPV: Not applicable - Zostavax vaccine: Not applicable  SCREENING: -Mammogram: appointment this week  - Colonoscopy: Not applicable - Bone Density: Not applicable  -Hearing Test: Not applicable  -Spirometry: Not applicable   PATIENT COUNSELING:   For all adult patients, I recommend:  A well balanced diet low in saturated fats, cholesterol, and moderation in carbohydrates.   This can be as simple as monitoring portion sizes and cutting back on sugary beverages such as  soda and juice to start with.    Daily water consumption of at least 64 ounces.  Physical activity at least 180 minutes per week, if just starting out.   This can be as simple as taking the stairs instead of the elevator and walking 2-3 laps around the  office purposefully every day.   STD protection, partner selection, and regular testing if high risk.  Limited consumption of alcoholic beverages if alcohol is consumed.  For women, I recommend no more than 7 alcoholic beverages per week, spread out throughout the  week.  Avoid "binge" drinking or consuming large quantities of alcohol in one setting.   Please let me know if you feel you may need help with reduction or quitting alcohol consumption.   Avoidance of nicotine, if used.  Please let me know if you feel you may need help with reduction or quitting nicotine use.   Daily mental health attention.  This can be in the form of 5 minute daily meditation, prayer, journaling, yoga, reflection, etc.   Purposeful attention to your emotions and mental state can significantly improve your overall  wellbeing and  health.  Please know that I am here to help you with all of your health care goals and am happy to work with you to find a solution that works best for you.  The greatest advice I have received with any changes in life are to take it one step at a time, that even means if all you can focus on is the next 60  seconds, then do that and celebrate your victories.  With any changes in life, you will have set backs, and that is OK. The important thing to remember is, if you have a set back, it is not a failure, it is an opportunity to try again!  _____________________________________________________________________________________________  Recommended Screenings: For your age, I recommend the following labs/tests:  Breast Cancer Screening:  Mammogram every year, based on results.   scheduled for March 10.  Cervical Cancer Screening:  Co screening with HPV every 5 years, based on results.   Scheduled with OBGYN  _____________________________________________________________________________________________  Labs today:  CBC (complete blood count)- to monitor your white and red blood cells CMP (complete metabolic panel)- to monitor your blood sugar, liver function, kidney function, and electrolytes Lipid Panel (cholesterol)- to monitor your cholesterol levels Vitamin D- history of low Vitamin D levels  Additional tests may be needed be necessary based on the results of the above tests. I will notify you if additional tests are required and discuss this with you prior to ordering these tests.      NEXT PREVENTATIVE PHYSICAL DUE IN 1 YEAR. Return in about 1 year (around 05/14/2021) for CPE or sooner if needed.

## 2020-05-14 NOTE — Assessment & Plan Note (Signed)
See A&P for difficulty maintaining weight for full plan.

## 2020-05-14 NOTE — Assessment & Plan Note (Signed)
Annual physical exam with no concerning findings on evaluation.  Will obtain lab work and make changes to plan of care if needed based on results.  Plan to follow-up in 1 year for annual exam or sooner if needed.

## 2020-05-14 NOTE — Patient Instructions (Addendum)
Thank you for choosing [place] at [facility] for your Primary Care needs. I am excited for the opportunity to partner with you to meet your health care goals. It was a pleasure seeing you today!  For your information, our office hours are Monday- Friday 8:00 AM - 5:00 PM At this time I am not in the office on Wednesdays.  If you have any needs, please feel free to send a MyChart message or call our office at [phone] and we will respond as quickly as possible.   If you had labs drawn today for your visit, you will receive notification on MyChart as soon as the labs have resulted. You will see these results before me. I ask that you please allow 72 business hours for me to view the results. I will send you a MyChart message with lab results and if there are abnormalities that we need to discuss, you will receive a phone call from the office. If you have not heard anything within 72 hours through MyChart or a phone call, please feel free to contact the office to let us know.   I typically respond to MyChart messages within 24 hours during the work week.  Most messages must be routed to me even when the message is selected directly for me. This may cause a delay in my response. Your needs are very important to me, if you need a faster response, please feel free to call the office and a message will be directed to me.  Due to the routing process, messages sent over the weekend may not get to me until Monday.   If you have non-urgent medical needs after hours, please call the office line and speak to our on-call provider. We share on-call services with another [place] office, therefore, you may receive a return call from a provider outside of this office.   If you have urgent medical needs after hours, please seek care in an Urgent Care or Emergency Room. Our building has a 24 hour emergency department on the 1st floor for your convenience.   Please do not  hesitate to reach out to [place] with concerns.   Thank you, again, for choosing me as your health care partner. I appreciate your trust and look forward to learning more about you.   SaraBeth Early, DNP, AGNP-c _____________________________________________________________________________________________  For all adult patients, I recommend:  A well balanced diet low in saturated fats, cholesterol, and moderation in carbohydrates.   This can be as simple as monitoring portion sizes and cutting back on sugary beverages such as  soda and juice to start with.    Daily water consumption of at least 64 ounces.  Physical activity at least 180 minutes per week, if just starting out.   This can be as simple as taking the stairs instead of the elevator and walking 2-3 laps around the  office purposefully every day.   STD protection, partner selection, and regular testing if high risk.  Limited consumption of alcoholic beverages if alcohol is consumed.  For women, I recommend no more than 7 alcoholic beverages per week, spread out throughout the  week.  Avoid "binge" drinking or consuming large quantities of alcohol in one setting.   Please let me know if you feel you may need help with reduction or quitting alcohol consumption.   Avoidance of nicotine, if used.  Please let me know if you feel you may need help with reduction or quitting nicotine  use.   Daily mental health attention.  This can be in the form of 5 minute daily meditation, prayer, journaling, yoga, reflection, etc.   Purposeful attention to your emotions and mental state can significantly improve your overall  wellbeing and health.  Please know that I am here to help you with all of your health care goals and am happy to work with you to find a solution that works best for you.  The greatest advice I have received with any changes in life are to take it one step at a time, that even means if all you can focus on is the next 60  seconds, then do that and celebrate your victories.  With any changes in life, you will have set backs, and that is OK. The important thing to remember is, if you have a set back, it is not a failure, it is an opportunity to try again!  _____________________________________________________________________________________________  Recommended Screenings: For your age, I recommend the following labs/tests:  Breast Cancer Screening:  Mammogram every year, based on results.   It appears this is scheduled for March 10.  Cervical Cancer Screening:  Co screening with HPV every 5 years, based on results.   Scheduled with OBGYN  _____________________________________________________________________________________________  Labs today:  CBC (complete blood count)- to monitor your white and red blood cells CMP (complete metabolic panel)- to monitor your blood sugar, liver function, kidney function, and electrolytes Lipid Panel (cholesterol)- to monitor your cholesterol levels Vitamin D- history of low Vitamin D levels  Additional tests may be needed be necessary based on the results of the above tests. I will notify you if additional tests are required and discuss this with you prior to ordering these tests.

## 2020-05-14 NOTE — Assessment & Plan Note (Signed)
Working on weight loss with changes to diet and exercise. She has increased water consumption and is working on monitoring her intake and making long term changes to her habits. She has lost 10 lbs since the start of the new year.  Patient praised for excellent success and changes consistent with long term weight management.  Will follow along and may consider medication management in the future if her current regimen stalls.

## 2020-05-14 NOTE — Assessment & Plan Note (Signed)
Establish care with new PCP due to transfer of old PCP to a new specialty.

## 2020-05-14 NOTE — Assessment & Plan Note (Signed)
History of vitamin d deficiency. It has been a while since she has had labs. Will obtain vitamin D labs today.

## 2020-05-15 NOTE — Progress Notes (Signed)
Labs reviewed.   Vitamin D is normal, but on the lower side. Consider taking a vitamin D supplement of 800 IU daily to help with replacement for protection of bone health.   Your HDL (good cholesterol) is fantastic at 58, which has bumped the overall number up, but this is protective to your blood vessels and prevention of cardiovascular disease. Your LDL (bad cholesterol) is just a little high, but no need for any changes today. I recommend you monitor your saturated fat intake and incorporate a daily walk into your routine and this should come down. We can check again in a year.   Your metabolic panel looks great! No signs of electrolyte abnormality, kidney or liver dysfunction, or elevated blood sugars!   Your blood counts look great, also! No signs of anemia or infection.   You are looking fantastic!! Keep up the great work! SaraBeth

## 2020-05-16 ENCOUNTER — Telehealth (HOSPITAL_BASED_OUTPATIENT_CLINIC_OR_DEPARTMENT_OTHER): Payer: Self-pay

## 2020-05-16 NOTE — Telephone Encounter (Signed)
Pt is aware and agreeable to lab results and recommedations.  Pt is aware that lab results were sent to her via [place].  Pt is aware and agreeable to recommendation of starting an OTC vitamin D supplement.

## 2020-05-16 NOTE — Telephone Encounter (Signed)
-----   Message from Orma Render, NP sent at 05/15/2020  2:51 PM EST ----- Labs reviewed.   Vitamin D is normal, but on the lower side. Consider taking a vitamin D supplement of 800 IU daily to help with replacement for protection of bone health.   Your HDL (good cholesterol) is fantastic at 55, which has bumped the overall number up, but this is protective to your blood vessels and prevention of cardiovascular disease. Your LDL (bad cholesterol) is just a little high, but no need for any changes today. I recommend you monitor your saturated fat intake and incorporate a daily walk into your routine and this should come down. We can check again in a year.   Your metabolic panel looks great! No signs of electrolyte abnormality, kidney or liver dysfunction, or elevated blood sugars!   Your blood counts look great, also! No signs of anemia or infection.   You are looking fantastic!! Keep up the great work! SaraBeth

## 2020-05-17 ENCOUNTER — Other Ambulatory Visit: Payer: Self-pay

## 2020-05-17 ENCOUNTER — Ambulatory Visit
Admission: RE | Admit: 2020-05-17 | Discharge: 2020-05-17 | Disposition: A | Discharge status: Home / Self Care | Payer: 59 | Source: Ambulatory Visit | Attending: Nurse Practitioner | Admitting: Nurse Practitioner

## 2020-05-17 DIAGNOSIS — Z1231 Encounter for screening mammogram for malignant neoplasm of breast: Secondary | ICD-10-CM

## 2020-05-21 ENCOUNTER — Telehealth (HOSPITAL_BASED_OUTPATIENT_CLINIC_OR_DEPARTMENT_OTHER): Payer: Self-pay

## 2020-05-21 NOTE — Telephone Encounter (Signed)
Results released and reviewed by patient via [place] patient to contact the office with any questions or concerns.

## 2020-05-21 NOTE — Progress Notes (Signed)
Mammogram shows no evidence of malignancy or need for additional follow-up. Plan next mammogram in 12 months.

## 2020-05-21 NOTE — Telephone Encounter (Signed)
-----   Message from Orma Render, NP sent at 05/21/2020  9:37 AM EDT ----- Mammogram shows no evidence of malignancy or need for additional follow-up. Plan next mammogram in 12 months.

## 2020-05-29 ENCOUNTER — Telehealth (HOSPITAL_BASED_OUTPATIENT_CLINIC_OR_DEPARTMENT_OTHER): Payer: Self-pay

## 2020-05-29 DIAGNOSIS — D2372 Other benign neoplasm of skin of left lower limb, including hip: Secondary | ICD-10-CM | POA: Diagnosis not present

## 2020-05-29 DIAGNOSIS — D1801 Hemangioma of skin and subcutaneous tissue: Secondary | ICD-10-CM | POA: Diagnosis not present

## 2020-05-29 DIAGNOSIS — D2261 Melanocytic nevi of right upper limb, including shoulder: Secondary | ICD-10-CM | POA: Diagnosis not present

## 2020-05-29 DIAGNOSIS — L821 Other seborrheic keratosis: Secondary | ICD-10-CM | POA: Diagnosis not present

## 2020-05-29 DIAGNOSIS — D225 Melanocytic nevi of trunk: Secondary | ICD-10-CM | POA: Diagnosis not present

## 2020-05-29 DIAGNOSIS — D2262 Melanocytic nevi of left upper limb, including shoulder: Secondary | ICD-10-CM | POA: Diagnosis not present

## 2020-05-29 DIAGNOSIS — D2272 Melanocytic nevi of left lower limb, including hip: Secondary | ICD-10-CM | POA: Diagnosis not present

## 2020-05-29 DIAGNOSIS — L918 Other hypertrophic disorders of the skin: Secondary | ICD-10-CM | POA: Diagnosis not present

## 2020-05-29 NOTE — Telephone Encounter (Signed)
Results released by Sarabeth Early, AGNP and reviewed by patient via MyChart. Instructed patient to contact the office with any questions or concerns.  

## 2020-05-29 NOTE — Telephone Encounter (Signed)
-----   Message from Orma Render, NP sent at 05/21/2020  9:37 AM EDT ----- Mammogram shows no evidence of malignancy or need for additional follow-up. Plan next mammogram in 12 months.

## 2020-05-30 ENCOUNTER — Encounter (HOSPITAL_BASED_OUTPATIENT_CLINIC_OR_DEPARTMENT_OTHER): Payer: Self-pay | Admitting: Nurse Practitioner

## 2020-06-21 NOTE — Progress Notes (Signed)
Kelsey [name] Phone: [phone] Subjective:   Kelsey [name], am serving as a scribe for Dr. Hulan Saas. This visit occurred during the SARS-CoV-2 public health emergency.  Safety protocols were in place, including screening questions prior to the visit, additional usage of staff PPE, and extensive cleaning of exam room while observing appropriate contact time as indicated for disinfecting solutions.   I'm seeing this patient by the request  of:  Early, Coralee Pesa, NP  CC: Neck pain follow-up, new onset hip pain  [id number]  Kelsey [name] is a 40 y.o. female coming in with complaint of back and neck pain. OMT 04/24/2020. Patient states that she is feeling better. Is here for maintenance.   Medications patient has been prescribed: None  Taking:         Reviewed prior external information including notes and imaging from previsou exam, outside providers and external EMR if available.   As well as notes that were available from care everywhere and other healthcare systems.  Past medical history, social, surgical and family history all reviewed in electronic medical record.  [name] pertanent information unless stated regarding to the chief complaint.   Past Medical History:  Diagnosis Date  . Headache(784.0)   . Overweight (BMI 25.0-29.9) 01/23/2018  . PONV (postoperative nausea and vomiting)   . Pregnancy induced hypertension     Allergies  Allergen Reactions  . Sulfa Antibiotics Rash  . Sulfonamide Derivatives Rash     Review of Systems:  [name] headache, visual changes, nausea, vomiting, diarrhea, constipation, dizziness, abdominal pain, skin rash, fevers, chills, night sweats, weight loss, swollen lymph nodes, body aches, joint swelling, chest pain, shortness of breath, mood changes. POSITIVE muscle aches  Objective  Blood pressure 102/66, pulse 81, height 5\' 1"  (1.549 m), unknown if currently  breastfeeding.   General: [name] apparent distress alert and oriented x3 mood and affect normal, dressed appropriately.  HEENT: Pupils equal, extraocular movements intact  Respiratory: Patient's speak in full sentences and does not appear short of breath  Cardiovascular: [name] lower extremity edema, non tender, [name] erythema  Gait normal with good balance and coordination.  MSK: Right hip fairly severely tender to palpation over the greater trochanteric area.  Patient does have a positive Faber on the side.  Negative straight leg test. Neck exam shows the patient does have some very mild tenderness to palpation of the paraspinal musculature still noted.  Osteopathic findings  C2 flexed rotated and side bent right C6 flexed rotated and side bent left T3 extended rotated and side bent right inhaled rib  After verbal consent patient was prepped with alcohol swab and with a 21-gauge 2 inch needle injected into the right greater trochanteric area with a total of 2 cc of 0.5% Marcaine and 1 cc of Kenalog 40 mg/mL.  [name] blood loss.  Postinjection instructions given      Assessment and Plan:  Greater trochanteric bursitis of right hip Patient given injection today.  Tolerated the procedure well.  Pain has been going on for 2 months.  Given some exercises, discussed topical anti-inflammatories and does have oral anti-inflammatories if needed.  Follow-up with me again in 6 to 8 weeks  Cervical radiculopathy at C6 Likely patient has not had any more radicular symptoms.  Patient is doing well.  Responded well to manipulation.  Patient hopefully will continue to respond well and follow-up with me again 6 to 8 weeks    Nonallopathic  problems  Decision today to treat with OMT was based on Physical Exam  After verbal consent patient was treated with HVLA, ME, FPR techniques in cervical, rib, thoracic areas  Patient tolerated the procedure well with improvement in symptoms  Patient given exercises,  stretches and lifestyle modifications  See medications in patient instructions if given  Patient will follow up in 4-8 weeks      The above documentation has been reviewed and is accurate and complete Lyndal Pulley, DO       Note: This dictation was prepared with Dragon dictation along with smaller phrase technology. Any transcriptional errors that result from this process are unintentional.

## 2020-06-26 ENCOUNTER — Encounter: Payer: Self-pay | Admitting: Family Medicine

## 2020-06-26 ENCOUNTER — Ambulatory Visit: Payer: 59 | Admitting: Family Medicine

## 2020-06-26 ENCOUNTER — Other Ambulatory Visit: Payer: Self-pay

## 2020-06-26 VITALS — BP 102/66 | HR 81 | Ht 61.0 in

## 2020-06-26 DIAGNOSIS — M5412 Radiculopathy, cervical region: Secondary | ICD-10-CM

## 2020-06-26 DIAGNOSIS — M9908 Segmental and somatic dysfunction of rib cage: Secondary | ICD-10-CM | POA: Diagnosis not present

## 2020-06-26 DIAGNOSIS — M9901 Segmental and somatic dysfunction of cervical region: Secondary | ICD-10-CM | POA: Diagnosis not present

## 2020-06-26 DIAGNOSIS — M7061 Trochanteric bursitis, right hip: Secondary | ICD-10-CM | POA: Diagnosis not present

## 2020-06-26 DIAGNOSIS — M9902 Segmental and somatic dysfunction of thoracic region: Secondary | ICD-10-CM | POA: Diagnosis not present

## 2020-06-26 NOTE — Patient Instructions (Signed)
Good to see you GT exercises Injection today See me again in 2 months

## 2020-06-26 NOTE — Assessment & Plan Note (Signed)
Likely patient has not had any more radicular symptoms.  Patient is doing well.  Responded well to manipulation.  Patient hopefully will continue to respond well and follow-up with me again 6 to 8 weeks

## 2020-06-26 NOTE — Assessment & Plan Note (Signed)
Patient given injection today.  Tolerated the procedure well.  Pain has been going on for 2 months.  Given some exercises, discussed topical anti-inflammatories and does have oral anti-inflammatories if needed.  Follow-up with me again in 6 to 8 weeks

## 2020-08-24 NOTE — Progress Notes (Signed)
[facility] [address] [place] Phone: [phone] Subjective:   I Kelsey [name] am serving as a Education administrator for Dr. Hulan Saas.  This visit occurred during the SARS-CoV-2 public health emergency.  Safety protocols were in place, including screening questions prior to the visit, additional usage of staff PPE, and extensive cleaning of exam room while observing appropriate contact time as indicated for disinfecting solutions.   I'm seeing this patient by the request  of:  Early, Coralee Pesa, NP  CC: Neck and back pain follow-up  [id number]  Kelsey [name] is a 40 y.o. female coming in with complaint of back and neck pain. OMT 06/26/2020. Neck is much better. Patient states she has wrist pain today. Left sided pain on the radial side. States it burns. Pain with driving and at night. States she has had this before and it went away.  Patient is having more of the left wrist pain.  This is affecting more than pain at this moment.  Hip pain that patient was having previously is completely resolved.  Medications patient has been prescribed: None          Reviewed prior external information including notes and imaging from previsou exam, outside providers and external EMR if available.   As well as notes that were available from care everywhere and other healthcare systems.  Past medical history, social, surgical and family history all reviewed in electronic medical record.  No pertanent information unless stated regarding to the chief complaint.   Past Medical History:  Diagnosis Date   Headache(784.0)    Overweight (BMI 25.0-29.9) 01/23/2018   PONV (postoperative nausea and vomiting)    Pregnancy induced hypertension     Allergies  Allergen Reactions   Sulfa Antibiotics Rash   Sulfonamide Derivatives Rash     Review of Systems:  No headache, visual changes, nausea, vomiting, diarrhea, constipation, dizziness, abdominal pain,  skin rash, fevers, chills, night sweats, weight loss, swollen lymph nodes, body aches, joint swelling, chest pain, shortness of breath, mood changes. POSITIVE muscle aches  Objective  Blood pressure 130/90, pulse (!) 104, height 5\' 1"  (1.549 m), SpO2 100 %, unknown if currently breastfeeding.   General: No apparent distress alert and oriented x3 mood and affect normal, dressed appropriately.  HEENT: Pupils equal, extraocular movements intact  Respiratory: Patient's speak in full sentences and does not appear short of breath  Cardiovascular: No lower extremity edema, non tender, no erythema  Left wrist exam shows the patient does have some positive Finkelstein's noted. Patient otherwise has good range of motion.  No pain in the anatomical snuffbox.  No pain over the [facility].  Good grip strength.  Procedure: Real-time Ultrasound Guided Injection of left Abductor pollicis longs tendon sheath Device: GE Logiq Q7  Ultrasound guided injection is preferred based studies that show increased duration, increased effect, greater accuracy, decreased procedural pain, increased response rate with ultrasound guided versus blind injection.  Verbal informed consent obtained.  Time-out conducted.  Noted no overlying erythema, induration, or other signs of local infection.  Skin prepped in a sterile fashion.  Local anesthesia: Topical Ethyl chloride.  With sterile technique and under real time ultrasound guidance:  tendon visualized.  23g 5/8 inch needle inserted distal to proximal approach into tendon sheath. Pictures taken  for needle placement. Patient did have injection of 0.5 cc of of 0.5% Marcaine, and 0.5 cc of Kenalog 40 mg/dL. Completed without difficulty  Pain immediately improved suggesting accurate placement  of the medication.  Advised to call if fevers/chills, erythema, induration, drainage, or persistent bleeding.  Impression: Technically successful ultrasound guided injection.      Assessment  and Plan:        The above documentation has been reviewed and is accurate and complete Lyndal Pulley, DO        Note: This dictation was prepared with Dragon dictation along with smaller phrase technology. Any transcriptional errors that result from this process are unintentional.

## 2020-08-28 ENCOUNTER — Other Ambulatory Visit: Payer: Self-pay

## 2020-08-28 ENCOUNTER — Encounter: Payer: Self-pay | Admitting: Family Medicine

## 2020-08-28 ENCOUNTER — Ambulatory Visit: Payer: Self-pay

## 2020-08-28 ENCOUNTER — Ambulatory Visit: Payer: 59 | Admitting: Family Medicine

## 2020-08-28 VITALS — BP 130/90 | HR 104 | Ht 61.0 in

## 2020-08-28 DIAGNOSIS — M25532 Pain in left wrist: Secondary | ICD-10-CM

## 2020-08-28 DIAGNOSIS — M654 Radial styloid tenosynovitis [de Quervain]: Secondary | ICD-10-CM

## 2020-08-28 NOTE — Patient Instructions (Signed)
Good to see you Pennsaid 2 times a day small fingertip sized amount Wrist brace today See me again in 2 months

## 2020-08-28 NOTE — Assessment & Plan Note (Signed)
Patient given injection and tolerated the procedure well.  Discussed home exercises, thumb spica splint given, discussed as follows 1.  Patient will be traveling and will see patient again in 4 to 8 weeks

## 2020-09-04 MED FILL — Trazodone HCl Tab 50 MG: ORAL | 90 days supply | Qty: 90 | Fill #0 | Status: AC

## 2020-09-05 ENCOUNTER — Other Ambulatory Visit (HOSPITAL_COMMUNITY): Payer: Self-pay

## 2020-09-09 ENCOUNTER — Encounter (HOSPITAL_BASED_OUTPATIENT_CLINIC_OR_DEPARTMENT_OTHER): Payer: Self-pay | Admitting: Nurse Practitioner

## 2020-09-11 ENCOUNTER — Other Ambulatory Visit (HOSPITAL_COMMUNITY): Payer: Self-pay

## 2020-09-11 ENCOUNTER — Other Ambulatory Visit (HOSPITAL_BASED_OUTPATIENT_CLINIC_OR_DEPARTMENT_OTHER): Payer: Self-pay | Admitting: Nurse Practitioner

## 2020-09-11 DIAGNOSIS — T753XXA Motion sickness, initial encounter: Secondary | ICD-10-CM

## 2020-09-11 MED ORDER — SCOPOLAMINE 1 MG/3DAYS TD PT72
1.0000 | MEDICATED_PATCH | TRANSDERMAL | 12 refills | Status: DC
  Filled 2020-09-11: qty 8, 24d supply, fill #0

## 2020-09-12 ENCOUNTER — Other Ambulatory Visit (HOSPITAL_COMMUNITY): Payer: Self-pay

## 2020-10-22 ENCOUNTER — Other Ambulatory Visit: Payer: Self-pay

## 2020-10-22 ENCOUNTER — Ambulatory Visit (HOSPITAL_BASED_OUTPATIENT_CLINIC_OR_DEPARTMENT_OTHER): Payer: 59 | Admitting: Nurse Practitioner

## 2020-10-22 ENCOUNTER — Encounter (HOSPITAL_BASED_OUTPATIENT_CLINIC_OR_DEPARTMENT_OTHER): Payer: Self-pay | Admitting: Nurse Practitioner

## 2020-10-22 VITALS — BP 126/74 | HR 68 | Ht 61.0 in | Wt 161.4 lb

## 2020-10-22 DIAGNOSIS — M940 Chondrocostal junction syndrome [Tietze]: Secondary | ICD-10-CM | POA: Insufficient documentation

## 2020-10-22 HISTORY — DX: Chondrocostal junction syndrome (tietze): M94.0

## 2020-10-22 NOTE — Patient Instructions (Signed)
Information on chostrocondritis included, although this is a little different to slipping rib syndrome, the inflammation in the cartilage is similar.    Recommend ibuprofen '800mg'$  every 6-8 hours for pain and to help reducing inflammation.  Heat may be applied to area for 20 minutes at a time. Ice may be used, as well, for 20 minutes at a time several times a day, if this helps.   Recommend avoiding sharp twisting to help prevent recurrence of symptoms.   If symptoms return in frequency or pain worsens, please let me know and we can consider imaging or PT for the issue.

## 2020-10-22 NOTE — Assessment & Plan Note (Signed)
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 No evidence of cardiac or GI concerns today. Etiology of condition and recommendations discussed with patient. Reassured that condition is benign, although can be recurrent and painful.  Joint decision to postpone imaging at this time given the benign nature of the syndrome and strong suspicion.  Discussed with patient if the symptoms worsen or become more frequent, MRI can be performed to evaluate the cartilage.  At this time recommend avoidance of sharp turning movements that may exacerbate issues. Recommend ibuprofen '800mg'$  every 6-8 hours as needed for pain. May also use heat to the area.  Can consider PT if symptoms persist.  Recommend follow-up if symptoms become more frequent or changes occur for further evaluation.

## 2020-10-22 NOTE — Progress Notes (Signed)
Acute Office Visit  Subjective:    Patient ID: Kelsey [name], female    DOB: May 21, 1980, 40 y.o.   MRN: [id number]  Chief Complaint  Patient presents with   Pain    Left rib pain.  Off and on for Saturday. 2-3 on pain now, but on at its worse it is a 7.    HPI Patient is in today for lump in the right rib cage with pain.  Patient reports sudden onset of pain in the left chest wall over the weekend while seated in awkward position.  Endorses "lump" that appeared shortly thereafter. The lump lasted about 20-30 minutes and then resolved.  Pain located on the lower left rib cage proximal to the sternal border extending laterally approximately 3-4 inches laterally.  Intense pain lasted approximately 30 minutes with continued residual tenderness in the area.  Denies shortness of breath, palpitations, N/V, dizziness, arm pain, back pain, abdominal pain. No changes on bowel movements. No changes in bowel habits.  She states this has happened in the past for the past 1-2 years intermittently. With previous episodes the pain subsides fairly quickly. The initial incident was while reaching across the seats in her car to retrieve a cell phone between the seats.  She reports this only happens when she moves in an awkward position. Never while still, sitting, standing, or bending regularly.  She endorses the pain at onset was sharp and severe. It is now mild and can be felt with deep breath or palpation.  The bulging has subsided and has not returned.  She denies any known injury to the area. She has no ecchymosis or erythema or warmth. She denies clicking or popping in the area.  Pain is worse with unusual/twisting movements, palpation, and mildly with deep breaths.  Pain is resolved with sitting still during episodes and by avoiding the above movements with the after effects.    Past Medical History:  Diagnosis Date   Headache(784.0)    Overweight (BMI 25.0-29.9) 01/23/2018   PONV  (postoperative nausea and vomiting)    Pregnancy induced hypertension     Past Surgical History:  Procedure Laterality Date   CESAREAN SECTION  2008, 2013   CHOLECYSTECTOMY  12/2013   DILATION AND EVACUATION  10/04/2010   Procedure: DILATATION AND EVACUATION (D&E);  Surgeon: Marylynn Pearson;  Location: [facility];  Service: Gynecology;  Laterality: N/A;  pt last ate at 8:30am   WISDOM TOOTH EXTRACTION  2006    Family History  Problem Relation Age of Onset   Cancer Mother    Hypertension Father    Alcohol abuse Father    Heart disease Father     Social History   Socioeconomic History   Marital status: Married    Spouse name: Not on file   Number of children: Not on file   Years of education: Not on file   Highest education level: Not on file  Occupational History   Not on file  Tobacco Use   Smoking status: Never   Smokeless tobacco: Never  Substance and Sexual Activity   Alcohol use: No   Drug use: No   Sexual activity: Yes  Other Topics Concern   Not on file  Social History Narrative   Not on file   Social Determinants of Health   Financial Resource Strain: Not on file  Food Insecurity: Not on file  Transportation Needs: Not on file  Physical Activity: Not on file  Stress: Not on file  Social  Connections: Not on file  Intimate Partner Violence: Not on file    Outpatient Medications Prior to Visit  Medication Sig Dispense Refill   scopolamine (TRANSDERM-SCOP, 1.5 MG,) 1 MG/3DAYS Place 1 patch (1.5 mg total) onto the skin every 3 (three) days. 8 patch 12   traZODone (DESYREL) 50 MG tablet Take 0.5-1 tablets (25-50 mg total) by mouth at bedtime as needed for sleep. 90 tablet 2   traZODone (DESYREL) 50 MG tablet TAKE 1/2-1 TABLET (25-50 MG TOTAL) BY MOUTH AT BEDTIME AS NEEDED FOR SLEEP. 90 tablet 2   meloxicam (MOBIC) 7.5 MG tablet TAKE 1 TABLET (7.5 MG TOTAL) BY MOUTH DAILY. 30 tablet 0   tiZANidine (ZANAFLEX) 2 MG tablet TAKE 1 TABLET (2 MG TOTAL) BY MOUTH AT  BEDTIME. 30 tablet 0   No facility-administered medications prior to visit.    Allergies  Allergen Reactions   Sulfa Antibiotics Rash   Sulfonamide Derivatives Rash    Review of Systems All review of systems negative except what is listed in the HPI     Objective:    Physical Exam Vitals and nursing note reviewed. Exam conducted with a chaperone present.  Constitutional:      General: She is not in acute distress.    Appearance: Normal appearance.  HENT:     Head: Normocephalic and atraumatic.  Eyes:     Extraocular Movements: Extraocular movements intact.     Conjunctiva/sclera: Conjunctivae normal.     Pupils: Pupils are equal, round, and reactive to light.  Cardiovascular:     Rate and Rhythm: Normal rate.     Pulses: Normal pulses.  Pulmonary:     Effort: Pulmonary effort is normal.     Breath sounds: Normal breath sounds.  Chest:     Chest wall: Tenderness present. No mass, deformity, swelling, crepitus or edema. There is no dullness to percussion.       Comments: Tenderness with palpation along the sternal border and lower costal cartilage of the left side. No evidence of edema, erythema, warmth, or dislocation present. No pain or subluxation detected with bilateral pressure to the ribs.  Abdominal:     General: Abdomen is flat. There is no distension.     Palpations: Abdomen is soft. There is no mass.     Tenderness: There is no abdominal tenderness. There is no guarding or rebound.  Musculoskeletal:        General: Normal range of motion.     Cervical back: Normal range of motion.  Skin:    General: Skin is warm and dry.     Capillary Refill: Capillary refill takes less than 2 seconds.  Neurological:     General: No focal deficit present.     Mental Status: She is alert and oriented to person, place, and time.  Psychiatric:        Mood and Affect: Mood normal.        Behavior: Behavior normal.        Thought Content: Thought content normal.         Judgment: Judgment normal.    BP 126/74   Pulse 68   Ht '5\' 1"'$  (1.549 m)   Wt 161 lb 6.4 oz (73.2 kg)   SpO2 100%   BMI 30.50 kg/m  Wt Readings from Last 3 Encounters:  10/22/20 161 lb 6.4 oz (73.2 kg)  05/14/20 163 lb 12.8 oz (74.3 kg)  02/21/20 168 lb (76.2 kg)    Health Maintenance Due  Topic Date  Due   COVID-19 Vaccine (1) Never done   Hepatitis C Screening  Never done   PAP SMEAR-Modifier  08/10/2011    There are no preventive care reminders to display for this patient.   Lab Results  Component Value Date   TSH 2.36 08/24/2017   Lab Results  Component Value Date   WBC 6.5 05/14/2020   HGB 14.1 05/14/2020   HCT 41.7 05/14/2020   MCV 90.1 05/14/2020   PLT 361 05/14/2020   Lab Results  Component Value Date   NA 139 05/14/2020   K 4.1 05/14/2020   CO2 27 05/14/2020   GLUCOSE 80 05/14/2020   BUN 11 05/14/2020   CREATININE 0.83 05/14/2020   BILITOT 0.6 05/14/2020   ALKPHOS 67 05/14/2020   AST 17 05/14/2020   ALT 16 05/14/2020   PROT 7.6 05/14/2020   ALBUMIN 4.7 05/14/2020   CALCIUM 9.6 05/14/2020   ANIONGAP 9 05/14/2020   GFR 83.62 08/24/2017   Lab Results  Component Value Date   CHOL 229 (H) 05/14/2020   Lab Results  Component Value Date   HDL 78 05/14/2020   Lab Results  Component Value Date   LDLCALC 130 (H) 05/14/2020   Lab Results  Component Value Date   TRIG 106 05/14/2020   Lab Results  Component Value Date   CHOLHDL 2.9 05/14/2020   Lab Results  Component Value Date   HGBA1C 5.2 08/24/2017       Assessment & Plan:   Problem List Items Addressed This Visit     Slipping rib syndrome - Primary    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 No evidence of cardiac or GI concerns today. Etiology of condition and recommendations discussed with patient. Reassured that condition is benign, although can be recurrent and painful.  Joint decision to postpone imaging at this time given the benign nature of the  syndrome and strong suspicion.  Discussed with patient if the symptoms worsen or become more frequent, MRI can be performed to evaluate the cartilage.  At this time recommend avoidance of sharp turning movements that may exacerbate issues. Recommend ibuprofen '800mg'$  every 6-8 hours as needed for pain. May also use heat to the area.  Can consider PT if symptoms persist.  Recommend follow-up if symptoms become more frequent or changes occur for further evaluation.         No orders of the defined types were placed in this encounter.    Orma Render, NP

## 2020-10-29 NOTE — Progress Notes (Signed)
[facility] [place] Phone: [phone] Subjective:   Kelsey [name], am serving as a scribe for Dr. Hulan Saas.  I'm seeing this patient by the request  of:  Early, Coralee Pesa, NP  CC: Left wrist pain, neck pain follow-up  [id number]  08/28/2020 Patient given injection and tolerated the procedure well.  Discussed home exercises, thumb spica splint given, discussed as follows 1.  Patient will be traveling and will see patient again in 4 to 8 weeks  Update 10/29/2020 Kelsey [name] is a 40 y.o. female coming in with complaint of L wrist pain. Patient states that she is doing well. Patient wore the brace and did all the exercises and that helped a lot. Patient has only experiencing one little twinge and that's it.  Nothing significant at this moment.       Past Medical History:  Diagnosis Date   Headache(784.0)    Overweight (BMI 25.0-29.9) 01/23/2018   PONV (postoperative nausea and vomiting)    Pregnancy induced hypertension    Past Surgical History:  Procedure Laterality Date   CESAREAN SECTION  2008, 2013   CHOLECYSTECTOMY  12/2013   DILATION AND EVACUATION  10/04/2010   Procedure: DILATATION AND EVACUATION (D&E);  Surgeon: Marylynn Pearson;  Location: [facility];  Service: Gynecology;  Laterality: N/A;  pt last ate at 8:30am   [place]  2006   Social History   Socioeconomic History   Marital status: Married    Spouse name: Not on file   Number of children: Not on file   Years of education: Not on file   Highest education level: Not on file  Occupational History   Not on file  Tobacco Use   Smoking status: Never   Smokeless tobacco: Never  Substance and Sexual Activity   Alcohol use: No   Drug use: No   Sexual activity: Yes  Other Topics Concern   Not on file  Social History Narrative   Not on file   Social Determinants of Health   Financial Resource Strain: Not on file  Food  Insecurity: Not on file  Transportation Needs: Not on file  Physical Activity: Not on file  Stress: Not on file  Social Connections: Not on file   Allergies  Allergen Reactions   Sulfa Antibiotics Rash   Sulfonamide Derivatives Rash   Family History  Problem Relation Age of Onset   Cancer Mother    Hypertension Father    Alcohol abuse Father    Heart disease Father          Current Outpatient Medications (Other):    scopolamine (TRANSDERM-SCOP, 1.5 MG,) 1 MG/3DAYS, Place 1 patch (1.5 mg total) onto the skin every 3 (three) days.   traZODone (DESYREL) 50 MG tablet, Take 0.5-1 tablets (25-50 mg total) by mouth at bedtime as needed for sleep.   traZODone (DESYREL) 50 MG tablet, TAKE 1/2-1 TABLET (25-50 MG TOTAL) BY MOUTH AT BEDTIME AS NEEDED FOR SLEEP.   Reviewed prior external information including notes and imaging from  primary care provider As well as notes that were available from care everywhere and other healthcare systems.  Past medical history, social, surgical and family history all reviewed in electronic medical record.  No pertanent information unless stated regarding to the chief complaint.   Review of Systems:  No headache, visual changes, nausea, vomiting, diarrhea, constipation, dizziness, abdominal pain, skin rash, fevers, chills, night sweats, weight loss, swollen lymph  nodes, body aches, joint swelling, chest pain, shortness of breath, mood changes. POSITIVE muscle aches  Objective  Blood pressure 122/82, pulse 82, height '5\' 1"'$  (1.549 m), last menstrual period 10/16/2020, SpO2 98 %, unknown if currently breastfeeding.   General: No apparent distress alert and oriented x3 mood and affect normal, dressed appropriately.  HEENT: Pupils equal, extraocular movements intact  Respiratory: Patient's speak in full sentences and does not appear short of breath  Cardiovascular: No lower extremity edema, non tender, no erythema  Gait normal with good balance and  coordination.  MSK: Neck exam does have some loss of lordosis.  Some tenderness to palpation in the paraspinal musculature.  Patient does have some loss of lordosis of the neck as well as limited sidebending bilaterally. Patient's left wrist does have negative Finkelstein's but pain minorly over the scaphoid bone itself.  Good grip strength noted.  Osteopathic findings C2 flexed rotated and side bent right C4 flexed rotated and side bent left C6 flexed rotated and side bent left T3 extended rotated and side bent right inhaled third rib T9 extended rotated and side bent left     Impression and Recommendations:     The above documentation has been reviewed and is accurate and complete Lyndal Pulley, DO

## 2020-10-30 ENCOUNTER — Ambulatory Visit (INDEPENDENT_AMBULATORY_CARE_PROVIDER_SITE_OTHER): Payer: 59

## 2020-10-30 ENCOUNTER — Ambulatory Visit: Payer: 59 | Admitting: Family Medicine

## 2020-10-30 ENCOUNTER — Other Ambulatory Visit: Payer: Self-pay

## 2020-10-30 VITALS — BP 122/82 | HR 82 | Ht 61.0 in

## 2020-10-30 DIAGNOSIS — M9901 Segmental and somatic dysfunction of cervical region: Secondary | ICD-10-CM

## 2020-10-30 DIAGNOSIS — M25532 Pain in left wrist: Secondary | ICD-10-CM

## 2020-10-30 DIAGNOSIS — M654 Radial styloid tenosynovitis [de Quervain]: Secondary | ICD-10-CM | POA: Diagnosis not present

## 2020-10-30 DIAGNOSIS — M999 Biomechanical lesion, unspecified: Secondary | ICD-10-CM

## 2020-10-30 DIAGNOSIS — M940 Chondrocostal junction syndrome [Tietze]: Secondary | ICD-10-CM

## 2020-10-30 DIAGNOSIS — M9902 Segmental and somatic dysfunction of thoracic region: Secondary | ICD-10-CM

## 2020-10-30 DIAGNOSIS — M5412 Radiculopathy, cervical region: Secondary | ICD-10-CM | POA: Diagnosis not present

## 2020-10-30 DIAGNOSIS — M9908 Segmental and somatic dysfunction of rib cage: Secondary | ICD-10-CM | POA: Diagnosis not present

## 2020-10-30 NOTE — Assessment & Plan Note (Signed)
Known arthritic changes but doing relatively well.  Patient has not taken any true medications for this at the moment.  Discussed continuing to stay active.  Follow-up with me again in 3 months

## 2020-10-30 NOTE — Patient Instructions (Addendum)
Good to see you  L wrist x ray on the way out Stay active See me again in 3 weeks

## 2020-10-30 NOTE — Assessment & Plan Note (Signed)
Chronic problem.  Discussed icing regimen and home exercises patient does respond well to manipulation.  We will continue to follow-up again in 29-monthintervals.

## 2020-10-30 NOTE — Assessment & Plan Note (Signed)
Much improved.  We will continue to monitor.  Do not see anything on ultrasound.  We will get x-rays though to further evaluate the scaphoid with mild discomfort she is having.

## 2020-10-30 NOTE — Assessment & Plan Note (Signed)
   Decision today to treat with OMT was based on Physical Exam  After verbal consent patient was treated with HVLA, ME, FPR techniques in cervical, thoracic, rib,  areas, all areas are chronic   Patient tolerated the procedure well with improvement in symptoms  Patient given exercises, stretches and lifestyle modifications  See medications in patient instructions if given  Patient will follow up in 12 weeks

## 2020-11-19 NOTE — Progress Notes (Deleted)
  Kelsey [name] [place] Phone: [phone] Subjective:    I'm seeing this patient by the request  of:  Early, Coralee Pesa, NP  CC:   [id number]  Kelsey [name] is a 40 y.o. female coming in with complaint of back and neck pain. OMT 10/30/2020. Also f/u for L wrist pain. Patient states   Medications patient has been prescribed:   Taking:  Xray L wrist 10/30/2020 IMPRESSION: 1. Mild osteoarthritis.  No acute bony abnormality.       Reviewed prior external information including notes and imaging from previsou exam, outside providers and external EMR if available.   As well as notes that were available from care everywhere and other healthcare systems.  Past medical history, social, surgical and family history all reviewed in electronic medical record.  No pertanent information unless stated regarding to the chief complaint.   Past Medical History:  Diagnosis Date   Headache(784.0)    Overweight (BMI 25.0-29.9) 01/23/2018   PONV (postoperative nausea and vomiting)    Pregnancy induced hypertension     Allergies  Allergen Reactions   Sulfa Antibiotics Rash   Sulfonamide Derivatives Rash     Review of Systems:  No headache, visual changes, nausea, vomiting, diarrhea, constipation, dizziness, abdominal pain, skin rash, fevers, chills, night sweats, weight loss, swollen lymph nodes, body aches, joint swelling, chest pain, shortness of breath, mood changes. POSITIVE muscle aches  Objective  unknown if currently breastfeeding.   General: No apparent distress alert and oriented x3 mood and affect normal, dressed appropriately.  HEENT: Pupils equal, extraocular movements intact  Respiratory: Patient's speak in full sentences and does not appear short of breath  Cardiovascular: No lower extremity edema, non tender, no erythema  Neuro: Cranial nerves II through XII are intact, neurovascularly intact in all extremities  with 2+ DTRs and 2+ pulses.  Gait normal with good balance and coordination.  MSK:  Non tender with full range of motion and good stability and symmetric strength and tone of shoulders, elbows, wrist, hip, knee and ankles bilaterally.  Back - Normal skin, Spine with normal alignment and no deformity.  No tenderness to vertebral process palpation.  Paraspinous muscles are not tender and without spasm.   Range of motion is full at neck and lumbar sacral regions  Osteopathic findings  C2 flexed rotated and side bent right C6 flexed rotated and side bent left T3 extended rotated and side bent right inhaled rib T9 extended rotated and side bent left L2 flexed rotated and side bent right Sacrum right on right       Assessment and Plan:    Nonallopathic problems  Decision today to treat with OMT was based on Physical Exam  After verbal consent patient was treated with HVLA, ME, FPR techniques in cervical, rib, thoracic, lumbar, and sacral  areas  Patient tolerated the procedure well with improvement in symptoms  Patient given exercises, stretches and lifestyle modifications  See medications in patient instructions if given  Patient will follow up in 4-8 weeks      The above documentation has been reviewed and is accurate and complete Jacqualin Combes       Note: This dictation was prepared with Dragon dictation along with smaller phrase technology. Any transcriptional errors that result from this process are unintentional.

## 2020-11-20 ENCOUNTER — Ambulatory Visit: Payer: 59 | Admitting: Family Medicine

## 2020-12-21 MED FILL — Trazodone HCl Tab 50 MG: ORAL | 90 days supply | Qty: 90 | Fill #1 | Status: AC

## 2020-12-24 ENCOUNTER — Other Ambulatory Visit (HOSPITAL_COMMUNITY): Payer: Self-pay

## 2021-04-03 ENCOUNTER — Other Ambulatory Visit (HOSPITAL_COMMUNITY): Payer: Self-pay

## 2021-04-03 ENCOUNTER — Other Ambulatory Visit (HOSPITAL_BASED_OUTPATIENT_CLINIC_OR_DEPARTMENT_OTHER): Payer: Self-pay | Admitting: Nurse Practitioner

## 2021-04-03 MED ORDER — TRAZODONE HCL 50 MG PO TABS
ORAL_TABLET | ORAL | 2 refills | Status: DC
  Filled 2021-04-03: qty 90, 90d supply, fill #0
  Filled 2021-08-14 – 2021-08-29 (×2): qty 90, 90d supply, fill #1
  Filled 2021-11-22: qty 90, 90d supply, fill #2

## 2021-04-04 ENCOUNTER — Other Ambulatory Visit (HOSPITAL_COMMUNITY): Payer: Self-pay

## 2021-04-17 NOTE — Progress Notes (Signed)
[facility] [place] Phone: [phone] Subjective:   [name], am serving as a scribe for Dr. Hulan Saas.  This visit occurred during the SARS-CoV-2 public health emergency.  Safety protocols were in place, including screening questions prior to the visit, additional usage of staff PPE, and extensive cleaning of exam room while observing appropriate contact time as indicated for disinfecting solutions.    I'm seeing this patient by the request  of:  Early, Coralee Pesa, NP  CC: wrist and back pain   [id number]  10/30/2020 Much improved.  We will continue to monitor.  Do not see anything on ultrasound.  We will get x-rays though to further evaluate the scaphoid with mild discomfort she is having.  Known arthritic changes but doing relatively well.  Patient has not taken any true medications for this at the moment.  Discussed continuing to stay active.  Follow-up with me again in 3 months  Chronic problem.  Discussed icing regimen and home exercises patient does respond well to manipulation.  We will continue to follow-up again in 1-month intervals.  OMT as well  Updated 04/18/2021 Kelsey [name] is a 41 y.o. female coming in with complaint of left wrist and back pain. Patient states that she is having the same issue with wrist. Last injected in July. Injection was helpful. Pain has been worsening over past month. Patient notes L pointer finger pain as well. Wearing brace at night.        Past Medical History:  Diagnosis Date   Headache(784.0)    Overweight (BMI 25.0-29.9) 01/23/2018   PONV (postoperative nausea and vomiting)    Pregnancy induced hypertension    Past Surgical History:  Procedure Laterality Date   CESAREAN SECTION  2008, 2013   CHOLECYSTECTOMY  12/2013   DILATION AND EVACUATION  10/04/2010   Procedure: DILATATION AND EVACUATION (D&E);  Surgeon: Marylynn Pearson;  Location: [facility];  Service:  Gynecology;  Laterality: N/A;  pt last ate at 8:30am   [place]  2006   Social History   Socioeconomic History   Marital status: Married    Spouse name: Not on file   Number of children: Not on file   Years of education: Not on file   Highest education level: Not on file  Occupational History   Not on file  Tobacco Use   Smoking status: Never   Smokeless tobacco: Never  Substance and Sexual Activity   Alcohol use: No   Drug use: No   Sexual activity: Yes  Other Topics Concern   Not on file  Social History Narrative   Not on file   Social Determinants of Health   Financial Resource Strain: Not on file  Food Insecurity: Not on file  Transportation Needs: Not on file  Physical Activity: Not on file  Stress: Not on file  Social Connections: Not on file   Allergies  Allergen Reactions   Sulfa Antibiotics Rash   Sulfonamide Derivatives Rash   Family History  Problem Relation Age of Onset   Cancer Mother    Hypertension Father    Alcohol abuse Father    Heart disease Father          Current Outpatient Medications (Other):    scopolamine (TRANSDERM-SCOP, 1.5 MG,) 1 MG/3DAYS, Place 1 patch (1.5 mg total) onto the skin every 3 (three) days.   traZODone (DESYREL) 50 MG tablet, Take 0.5-1 tablets (25-50 mg total) by  mouth at bedtime as needed for sleep.   traZODone (DESYREL) 50 MG tablet, TAKE 1/2-1 TABLET (25-50 MG TOTAL) BY MOUTH AT BEDTIME AS NEEDED FOR SLEEP.    Review of Systems:  No headache, visual changes, nausea, vomiting, diarrhea, constipation, dizziness, abdominal pain, skin rash, fevers, chills, night sweats, weight loss, swollen lymph nodes, body aches, joint swelling, chest pain, shortness of breath, mood changes. POSITIVE muscle aches  Objective  Blood pressure 110/72, pulse 83, height 5\' 1"  (1.549 m), weight 161 lb (73 kg), SpO2 98 %, unknown if currently breastfeeding.   General: No apparent distress alert and oriented x3 mood and  affect normal, dressed appropriately.  HEENT: Pupils equal, extraocular movements intact  Respiratory: Patient's speak in full sentences and does not appear short of breath  Cardiovascular: No lower extremity edema, non tender, no erythema  Gait normal with good balance and coordination.  MSK: Patient's left wrist does have tenderness to palpation over the radial aspect.  Patient does have positive Wynn Maudlin noted.  Limited muscular skeletal ultrasound was performed and interpreted by Hulan Saas, M  Limited ultrasound of patient's wrist does show that patient does have hypoechoic changes of about the abductor pollicis longus tendon sheath.  Mild increase in neovascularization but no true tearing noted.  Patient does have some arthritic changes of the radiocarpal joint noted. Impression: Recurrent de Quervain's tenosynovitis.  Procedure: Real-time Ultrasound Guided Injection of left Abductor pollicis longs tendon sheath Device: GE Logiq Q7  Ultrasound guided injection is preferred based studies that show increased duration, increased effect, greater accuracy, decreased procedural pain, increased response rate with ultrasound guided versus blind injection.  Verbal informed consent obtained.  Time-out conducted.  Noted no overlying erythema, induration, or other signs of local infection.  Skin prepped in a sterile fashion.  Local anesthesia: Topical Ethyl chloride.  With sterile technique and under real time ultrasound guidance:  tendon visualized.  23g 5/8 inch needle inserted distal to proximal approach into tendon sheath. Pictures taken  for needle placement. Patient did have injection of 0.5 cc of of 0.5% Marcaine, and 0.5 cc of Kenalog 40 mg/dL. Completed without difficulty  Pain immediately resolved suggesting accurate placement of the medication.  Advised to call if fevers/chills, erythema, induration, drainage, or persistent bleeding.  Impression: Technically successful ultrasound  guided injection.    Impression and Recommendations:     The above documentation has been reviewed and is accurate and complete Lyndal Pulley, DO

## 2021-04-18 ENCOUNTER — Ambulatory Visit: Payer: 59 | Admitting: Family Medicine

## 2021-04-18 ENCOUNTER — Other Ambulatory Visit: Payer: Self-pay

## 2021-04-18 ENCOUNTER — Encounter: Payer: Self-pay | Admitting: Family Medicine

## 2021-04-18 ENCOUNTER — Ambulatory Visit: Payer: Self-pay

## 2021-04-18 VITALS — BP 110/72 | HR 83 | Ht 61.0 in | Wt 161.0 lb

## 2021-04-18 DIAGNOSIS — M654 Radial styloid tenosynovitis [de Quervain]: Secondary | ICD-10-CM

## 2021-04-18 DIAGNOSIS — M25532 Pain in left wrist: Secondary | ICD-10-CM

## 2021-04-18 NOTE — Patient Instructions (Addendum)
Injected L wrist Dr. Oneida Alar to do all office admin work for next 2 months Wear brace for 3 nights Read about PRP See me in 6 weeks

## 2021-04-19 ENCOUNTER — Encounter: Payer: Self-pay | Admitting: Family Medicine

## 2021-04-19 NOTE — Assessment & Plan Note (Signed)
Discussed with patient about home exercises, bracing again, topical anti-inflammatories.  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  We discussed with patient about icing regimen otherwise.  If continuing to have difficulty can also consider PRP.  Follow-up with me again in 6 weeks if not completely resolved

## 2021-05-09 ENCOUNTER — Ambulatory Visit: Payer: 59 | Admitting: Family Medicine

## 2021-05-16 ENCOUNTER — Encounter (HOSPITAL_BASED_OUTPATIENT_CLINIC_OR_DEPARTMENT_OTHER): Payer: Self-pay | Admitting: Nurse Practitioner

## 2021-05-16 ENCOUNTER — Ambulatory Visit (INDEPENDENT_AMBULATORY_CARE_PROVIDER_SITE_OTHER): Payer: 59 | Admitting: Nurse Practitioner

## 2021-05-16 ENCOUNTER — Other Ambulatory Visit (HOSPITAL_COMMUNITY): Payer: Self-pay

## 2021-05-16 ENCOUNTER — Other Ambulatory Visit: Payer: Self-pay

## 2021-05-16 VITALS — BP 117/72 | HR 72 | Ht 61.0 in | Wt 173.0 lb

## 2021-05-16 DIAGNOSIS — Z6832 Body mass index (BMI) 32.0-32.9, adult: Secondary | ICD-10-CM

## 2021-05-16 DIAGNOSIS — R110 Nausea: Secondary | ICD-10-CM | POA: Diagnosis not present

## 2021-05-16 DIAGNOSIS — Z0000 Encounter for general adult medical examination without abnormal findings: Secondary | ICD-10-CM

## 2021-05-16 MED ORDER — SEMAGLUTIDE-WEIGHT MANAGEMENT 0.5 MG/0.5ML ~~LOC~~ SOAJ
0.5000 mg | SUBCUTANEOUS | 2 refills | Status: AC
  Filled 2021-05-16 – 2021-06-05 (×4): qty 2, 28d supply, fill #0

## 2021-05-16 MED ORDER — SEMAGLUTIDE-WEIGHT MANAGEMENT 0.25 MG/0.5ML ~~LOC~~ SOAJ: 0.2500 mg | SUBCUTANEOUS | 0 refills | Status: AC

## 2021-05-16 MED ORDER — ONDANSETRON HCL 8 MG PO TABS
8.0000 mg | ORAL_TABLET | Freq: Three times a day (TID) | ORAL | 3 refills | Status: DC | PRN
  Filled 2021-05-16: qty 20, 7d supply, fill #0

## 2021-05-16 NOTE — Patient Instructions (Signed)
WEIGHT LOSS MEDICATION ?We are starting a medication today called Wegovy- also known as semaglutide.   ?This medication is once weekly injection to help with weight loss.  ?The medication, along with diet and exercise, has been shown to help decrease appetite, increase the feeling of fullness, and help you lose weight.  ?You may expect to lose about one pound per week with the starting dose- some people lose a little more and some a little less.  ?The medication dose is tapered to increase every 4 weeks until the maximum dose is achieved. A slow taper increase has been shown to decrease side effects.  ? ?You may notice symptoms of mild nausea, increased heartburn, feelings of fullness, constipation, or diarrhea when starting this medication. These side effects are typically mild and go away over time.  ?Plan to eat smaller and more frequent meals, high in protein, to help decrease side effects and gain maximum benefit.  ?Over the counter acid reducer medication, like Pepcid (famotidine) may be taken to help with symptoms of heartburn.  ?Over the counter stool softener, like Colace (docusate sodium) may be taken at bedtime to help with constipation.  ?Over the counter antidiarrheal medication, like Imodium (loperamide) may be taken to help with diarrhea.  ?If these symptoms are severe, not controlled with over the counter medications, or cause significant distress, please notify me immediately.  ? ?BASIC INSTRUCTIONS: PLEASE READ FULL MEDICATION PACKAGE INSERT BEFORE STARTING THE MEDICATION ?If you have any questions about the medication or how to properly give the injection, please reach out to the office or speak to your pharmacist for clarification.  ? ?You will give yourself one injection into the skin every 7 days. You may give the injection into your thighs, abdomen, or upper arms. You will want to be sure to rotate the injection site each time (do not give the injection in the same place two doses in a row).  When giving the injection in your abdomen, stay at least 2 inches away from the belly button.  ? ?Clean the skin around the area for injection with an alcohol swab or clean cotton swab saturated in rubbing alcohol. Allow the alcohol to dry before giving the injection. Remove the cap from the injector pen and press the tip of the pen against the area of skin where you want to give the injection until you hear a "click". HOLD the pen in place until you hear a second "click" to ensure that you have injected all of the medication. The small window on the injector pen also has a yellow indicator so that you can see the medication dispensing. Do not remove the pen from the injection site until all of the medication has been injected.  ? ?Once the pen has been used, the needle retracts into the pen to prevent accidental finger sticks. Dispose of the used pens properly and do not attempt to disassemble or reuse the pen to avoid being stuck.  ? ?You will need to store this medication in the original carton in the refrigerator until it is time to take the medication. Please refer the package insert if you must take the medication out of the refrigerator for recommended temperatures and length of time the medication can be out of the packaging.  ? ?FURTHER INSTRUCTIONS ?If you do well with this dose, we will plan to move you up to the next step, which is the 0.5 mg dose in 4 weeks time. You will need a follow-up visit at  that time to discuss how you are doing on the medication, monitor for any side effects, and evaluate your weight loss. We must have a visit, either in person or virtually, to discuss the medication prior to increasing the dose.  ? ?Please schedule your next visit so that you will be seen at least one week prior to the time the new dose is due. It is best for [place] to send this medication in at least one week in advance so that the pharmacy can ensure they have the medication on hand when you need it to avoid  missing doses and restarting the process.  ? ?Please continue to monitor your diet and avoid foods high in fat and carbohydrates. We recommend that you maintain an exercise regimen of at least 30 minutes a day for at least 5 days a week to reach the maximum benefit from the medication. The goal is to establish healthy choices while you are on the medication to aid in your weight loss and overall health and to help prevent rebound weight gain when you come off of the medication.  ? ? ?We will plan to move you up to the next dosage of Wegovy, which is the 0.'5mg'$  injection.  ?You will give yourself this dose 7 days after your last dose of medication.  ? ? ?You will give yourself this injection into your thighs, abdomen, or upper arms once a week for 4 weeks. Be sure to clean the skin with an alcohol swab and allow the skin to dry prior to each injection. Be sure to rotate the site of your injections with each dose. When using the abdomen, stay at least 2 inches away from the belly button. ? ?You will need to store this medication in the original carton in the refrigerator until it is time to take the medication. Please refer the package insert if you must take the medication out of the refrigerator for recommended temperatures and length of time the medication can be out of the packaging.  ? ?If you do well on the next dose, we will plan to move you up to the next step after 4 weeks on this dosage. You will need a follow-up visit at that time to discuss how you are doing on the medication, monitor for any side effects, and evaluate your weight loss.  ? ?Please schedule your next visit so that you will be seen at least one week prior to the time the new dose is due. It is best for [place] to send this medication in at least one week in advance so that the pharmacy can ensure they have the medication on hand when you need it to avoid missing doses and restarting the process.  ? ?Please continue to monitor your diet and avoid  foods high in fat and carbohydrates. We recommend that you maintain an exercise regimen of at least 30 minutes a day for at least 5 days a week to reach the maximum benefit from the medication. The goal is to establish healthy choices while you are on the medication to aid in your weight loss and overall health and to help prevent rebound weight gain when you come off of the medication.  ? ? ? ?

## 2021-05-16 NOTE — Progress Notes (Signed)
° °BP 117/72    Pulse 72    Ht 5' 1" (1.549 m)    Wt 173 lb (78.5 kg)    SpO2 98%    BMI 32.69 kg/m²   ° °Subjective:  ° ° Patient ID: Kelsey [name], female    DOB: 06/19/1980, 41 y.o.   MRN: [id number] ° °HPI: °Kelsey [name] is a 41 y.o. female presenting on 05/16/2021 for comprehensive medical examination.  ° °Current medical concerns include: weight gain ° °She reports regular vision exams q1-5y: yes °She reports regular dental exams q 6m: yes °Her diet consists of:  overall healthy options °She endorses exercise and/or activity of:  no routine activity °She works in:  Leadership with [employer]  ° °She denies ETOH use  °She denies nictoine use  °She denies illegal substance use  ° °She reports regular menstrual periods with no menopausal symptoms.  °She is currently sexually active with one sexual partner, her spouse °She denies concerns today about STI ° °She denies concerns about skin changes today  °She denies concerns about bowel changes today  °She denies concerns about bladder changes today  ° °Most Recent Depression Screen:  °Depression screen PHQ 2/9 05/20/2021 04/27/2018 08/24/2017 03/11/2016 02/12/2016  °Decreased Interest 0 0 0 0 0  °Down, Depressed, Hopeless 0 0 0 0 0  °PHQ - 2 Score 0 0 0 0 0  °Altered sleeping - 0 - - -  °Tired, decreased energy - 0 - - -  °Change in appetite - 0 - - -  °Feeling bad or failure about yourself  - 0 - - -  °Trouble concentrating - 0 - - -  °Moving slowly or fidgety/restless - 0 - - -  °Suicidal thoughts - 0 - - -  °PHQ-9 Score - 0 - - -  ° °Most Recent Anxiety Screen:  °No flowsheet data found. °Most Recent Fall Screen: °Fall Risk  05/20/2021 08/24/2017 03/11/2016 02/12/2016 01/28/2016  °Falls in the past year? 0 No No No No  °Number falls in past yr: 0 - - - -  °Injury with Fall? 0 - - - -  °Risk for fall due to : No Fall Risks - - - -  °Follow up Falls evaluation completed - - - -  ° ° °All ROS negative except what is listed above and in the HPI.  ° °Past medical  history, surgical history, medications, allergies, family history and social history reviewed with patient today and changes made to appropriate areas of the chart.  °Past Medical History:  °Past Medical History:  °Diagnosis Date  ° Headache(784.0)   ° Overweight (BMI 25.0-29.9) 01/23/2018  ° PONV (postoperative nausea and vomiting)   ° Pregnancy induced hypertension   ° °Medications:  °Current Outpatient Medications on File Prior to Visit  °Medication Sig  ° scopolamine (TRANSDERM-SCOP, 1.5 MG,) 1 MG/3DAYS Place 1 patch (1.5 mg total) onto the skin every 3 (three) days.  ° traZODone (DESYREL) 50 MG tablet TAKE 1/2-1 TABLET (25-50 MG TOTAL) BY MOUTH AT BEDTIME AS NEEDED FOR SLEEP.  ° °No current facility-administered medications on file prior to visit.  ° °Surgical History:  °Past Surgical History:  °Procedure Laterality Date  ° CESAREAN SECTION  2008, 2013  ° CHOLECYSTECTOMY  12/2013  ° DILATION AND EVACUATION  10/04/2010  ° Procedure: DILATATION AND EVACUATION (D&E);  Surgeon: Gretchen Adkins;  Location: [facility];  Service: Gynecology;  Laterality: N/A;  pt last ate at 8:30am  ° WISDOM TOOTH EXTRACTION    2006  ° °Allergies:  °Allergies  °Allergen Reactions  ° Sulfa Antibiotics Rash  ° Sulfonamide Derivatives Rash  ° °Social History:  °Social History  ° °Socioeconomic History  ° Marital status: Married  °  Spouse name: Not on file  ° Number of children: Not on file  ° Years of education: Not on file  ° Highest education level: Not on file  °Occupational History  ° Not on file  °Tobacco Use  ° Smoking status: Never  ° Smokeless tobacco: Never  °Substance and Sexual Activity  ° Alcohol use: No  ° Drug use: No  ° Sexual activity: Yes  °Other Topics Concern  ° Not on file  °Social History Narrative  ° Not on file  ° °Social Determinants of Health  ° °Financial Resource Strain: Not on file  °Food Insecurity: Not on file  °Transportation Needs: Not on file  °Physical Activity: Not on file  °Stress: Not on file  °Social  Connections: Not on file  °Intimate Partner Violence: Not on file  ° °Social History  ° °Tobacco Use  °Smoking Status Never  °Smokeless Tobacco Never  ° °Social History  ° °Substance and Sexual Activity  °Alcohol Use No  ° °Family History:  °Family History  °Problem Relation Age of Onset  ° Cancer Mother   ° Hypertension Father   ° Alcohol abuse Father   ° Heart disease Father   ° ° °   °Objective:  °  °BP 117/72    Pulse 72    Ht 5' 1" (1.549 m)    Wt 173 lb (78.5 kg)    SpO2 98%    BMI 32.69 kg/m²   °Wt Readings from Last 3 Encounters:  °05/16/21 173 lb (78.5 kg)  °04/18/21 161 lb (73 kg)  °10/22/20 161 lb 6.4 oz (73.2 kg)  °  °Physical Exam °Vitals and nursing note reviewed.  °Constitutional:   °   General: She is not in acute distress. °   Appearance: Normal appearance.  °HENT:  °   Head: Normocephalic and atraumatic.  °   Right Ear: Hearing, tympanic membrane, ear canal and external ear normal.  °   Left Ear: Hearing, tympanic membrane, ear canal and external ear normal.  °   Nose: Nose normal.  °   Right Sinus: No maxillary sinus tenderness or frontal sinus tenderness.  °   Left Sinus: No maxillary sinus tenderness or frontal sinus tenderness.  °   Mouth/Throat:  °   Lips: Pink.  °   Mouth: Mucous membranes are moist.  °   Pharynx: Oropharynx is clear.  °Eyes:  °   General: Lids are normal. Vision grossly intact.  °   Extraocular Movements: Extraocular movements intact.  °   Conjunctiva/sclera: Conjunctivae normal.  °   Pupils: Pupils are equal, round, and reactive to light.  °   Funduscopic exam: °   Right eye: Red reflex present.     °   Left eye: Red reflex present. °   Visual Fields: Right eye visual fields normal and left eye visual fields normal.  °Neck:  °   Thyroid: No thyromegaly.  °   Vascular: No carotid bruit.  °Cardiovascular:  °   Rate and Rhythm: Normal rate and regular rhythm.  °   Chest Wall: PMI is not displaced.  °   Pulses: Normal pulses.     °     Dorsalis pedis pulses are 2+ on the right  side and 2+   on the left side.  °     Posterior tibial pulses are 2+ on the right side and 2+ on the left side.  °   Heart sounds: Normal heart sounds. No murmur heard. °Pulmonary:  °   Effort: Pulmonary effort is normal. No respiratory distress.  °   Breath sounds: Normal breath sounds.  °Abdominal:  °   General: Abdomen is flat. Bowel sounds are normal. There is no distension.  °   Palpations: Abdomen is soft. There is no hepatomegaly, splenomegaly or mass.  °   Tenderness: There is no abdominal tenderness. There is no right CVA tenderness, left CVA tenderness, guarding or rebound.  °Musculoskeletal:     °   General: Normal range of motion.  °   Cervical back: Full passive range of motion without pain, normal range of motion and neck supple. No tenderness.  °   Right lower leg: No edema.  °   Left lower leg: No edema.  °Feet:  °   Left foot:  °   Toenail Condition: Left toenails are normal.  °Lymphadenopathy:  °   Cervical: No cervical adenopathy.  °   Upper Body:  °   Right upper body: No supraclavicular adenopathy.  °   Left upper body: No supraclavicular adenopathy.  °Skin: °   General: Skin is warm and dry.  °   Capillary Refill: Capillary refill takes less than 2 seconds.  °   Nails: There is no clubbing.  °Neurological:  °   General: No focal deficit present.  °   Mental Status: She is alert and oriented to person, place, and time.  °   GCS: GCS eye subscore is 4. GCS verbal subscore is 5. GCS motor subscore is 6.  °   Sensory: Sensation is intact.  °   Motor: Motor function is intact.  °   Coordination: Coordination is intact.  °   Gait: Gait is intact.  °   Deep Tendon Reflexes: Reflexes are normal and symmetric.  °Psychiatric:     °   Attention and Perception: Attention normal.     °   Mood and Affect: Mood normal.     °   Speech: Speech normal.     °   Behavior: Behavior normal. Behavior is cooperative.     °   Thought Content: Thought content normal.     °   Cognition and Memory: Cognition and memory  normal.     °   Judgment: Judgment normal.  ° ° °Results for orders placed or performed in visit on 05/16/21  °CBC with Differential/Platelet  °Result Value Ref Range  ° WBC 8.6 3.4 - 10.8 x10E3/uL  ° RBC 4.77 3.77 - 5.28 x10E6/uL  ° Hemoglobin 14.8 11.1 - 15.9 g/dL  ° Hematocrit 43.2 34.0 - 46.6 %  ° MCV 91 79 - 97 fL  ° MCH 31.0 26.6 - 33.0 pg  ° MCHC 34.3 31.5 - 35.7 g/dL  ° RDW 13.0 11.7 - 15.4 %  ° Platelets 327 150 - 450 x10E3/uL  ° Neutrophils 59 Not Estab. %  ° Lymphs 29 Not Estab. %  ° Monocytes 8 Not Estab. %  ° Eos 3 Not Estab. %  ° Basos 1 Not Estab. %  ° Neutrophils Absolute 5.1 1.4 - 7.0 x10E3/uL  ° Lymphocytes Absolute 2.5 0.7 - 3.1 x10E3/uL  ° Monocytes Absolute 0.7 0.1 - 0.9 x10E3/uL  ° EOS (ABSOLUTE) 0.3 0.0 - 0.4 x10E3/uL  °   Basophils Absolute 0.1 0.0 - 0.2 x10E3/uL  ° Immature Granulocytes 0 Not Estab. %  ° Immature Grans (Abs) 0.0 0.0 - 0.1 x10E3/uL  °Comprehensive metabolic panel  °Result Value Ref Range  ° Glucose 86 70 - 99 mg/dL  ° BUN 9 6 - 24 mg/dL  ° Creatinine, Ser 0.87 0.57 - 1.00 mg/dL  ° eGFR 85 >59 mL/min/1.73  ° BUN/Creatinine Ratio 10 9 - 23  ° Sodium 137 134 - 144 mmol/L  ° Potassium 4.3 3.5 - 5.2 mmol/L  ° Chloride 100 96 - 106 mmol/L  ° CO2 22 20 - 29 mmol/L  ° Calcium 10.3 (H) 8.7 - 10.2 mg/dL  ° Total Protein 7.2 6.0 - 8.5 g/dL  ° Albumin 4.9 (H) 3.8 - 4.8 g/dL  ° Globulin, Total 2.3 1.5 - 4.5 g/dL  ° Albumin/Globulin Ratio 2.1 1.2 - 2.2  ° Bilirubin Total 0.6 0.0 - 1.2 mg/dL  ° Alkaline Phosphatase 82 44 - 121 IU/L  ° AST 15 0 - 40 IU/L  ° ALT 13 0 - 32 IU/L  °Lipid panel  °Result Value Ref Range  ° Cholesterol, Total 229 (H) 100 - 199 mg/dL  ° Triglycerides 95 0 - 149 mg/dL  ° HDL 82 >39 mg/dL  ° VLDL Cholesterol Cal 16 5 - 40 mg/dL  ° LDL Chol Calc (NIH) 131 (H) 0 - 99 mg/dL  ° Chol/HDL Ratio 2.8 0.0 - 4.4 ratio  °Hemoglobin A1c  °Result Value Ref Range  ° Hgb A1c MFr Bld 5.1 4.8 - 5.6 %  ° Est. average glucose Bld gHb Est-mCnc 100 mg/dL  °TSH  °Result Value Ref Range  °  TSH 2.100 0.450 - 4.500 uIU/mL  °T4  °Result Value Ref Range  ° T4, Total 9.3 4.5 - 12.0 ug/dL  °T3  °Result Value Ref Range  ° T3, Total 137 71 - 180 ng/dL  °VITAMIN D 25 Hydroxy (Vit-D Deficiency, Fractures)  °Result Value Ref Range  ° Vit D, 25-Hydroxy 45.2 30.0 - 100.0 ng/mL  ° °   °Assessment & Plan:  ° °Problem List Items Addressed This Visit   ° ° Health maintenance examination - Primary  °  CPE today with no abnormal findings.  °UTD on HM activities. Sees GYN for pap- need records to update chart. °Will obtain labs today for evaluation.  °No concerns.  ° °  °  ° Relevant Orders  ° CBC with Differential/Platelet (Completed)  ° Comprehensive metabolic panel (Completed)  ° Lipid panel (Completed)  ° Hemoglobin A1c (Completed)  ° TSH (Completed)  ° T4 (Completed)  ° T3 (Completed)  ° VITAMIN D 25 Hydroxy (Vit-D Deficiency, Fractures) (Completed)  ° BMI 32.0-32.9,adult  °  Discussed weight management option with Wegovy. Patient is interested in trying this at this time.  °Will obtain baseline labs today. °Recommend small portions and increased water intake while on medication. Avoid meals high in carbohydrates and fat as these can precipitate nausea and vomiting. Notify immediately for severe abdominal pain or vomiting.  °May take colace or magnesium at bedtime for issues with constipation. Will send zofran for mild nausea symptoms.  °Recommend f/u in 3 months with phone visit or sooner if needed.  °  °  ° Relevant Medications  ° Semaglutide-Weight Management 0.25 MG/0.5ML SOAJ  ° Semaglutide-Weight Management 0.5 MG/0.5ML SOAJ (Start on 06/14/2021)  ° Nausea  °  PRN zofran provided for Wegovy °  °  ° Relevant Medications  ° ondansetron (ZOFRAN) 8 MG tablet  °  ° ° °  IMMUNIZATIONS:   - Tdap: Tetanus vaccination status reviewed: last tetanus booster within 10 years. - Influenza: Up to date - Pneumovax: Not applicable - Prevnar: Not applicable - HPV: Not applicable - Zostavax vaccine: Not  applicable  SCREENING: - Pap smear: up to date - STI testing: deferred -Mammogram: Up to date  - Colonoscopy: Up to date  - Bone Density: Not applicable  -Hearing Test: Not applicable  -Spirometry: Not applicable   Follow up plan: Return in about 3 months (around 08/16/2021) for Phone visit weight.  NEXT PREVENTATIVE PHYSICAL DUE IN 1 YEAR.  PATIENT COUNSELING PROVIDED:   For all adult patients, I recommend   A well balanced diet low in saturated fats, cholesterol, and moderation in carbohydrates.   This can be as simple as monitoring portion sizes and cutting back on sugary beverages such as soda and juice to start with.    Daily water consumption of at least 64 ounces.  Physical activity at least 180 minutes per week, if just starting out.   This can be as simple as taking the stairs instead of the elevator and walking 2-3 laps around the office  purposefully every day.   STD protection, partner selection, and regular testing if high risk.  Limited consumption of alcoholic beverages if alcohol is consumed.  For women, I recommend no more than 7 alcoholic beverages per week, spread out throughout the week.  Avoid "binge" drinking or consuming large quantities of alcohol in one setting.   Please let me know if you feel you may need help with reduction or quitting alcohol consumption.   Avoidance of nicotine, if used.  Please let me know if you feel you may need help with reduction or quitting nicotine use.   Daily mental health attention.  This can be in the form of 5 minute daily meditation, prayer, journaling, yoga, reflection, etc.   Purposeful attention to your emotions and mental state can significantly improve your overall wellbeing and Health.  Please know that I am here to help you with all of your health care goals and am happy to work with you to find a solution that works best for you.  The greatest advice I have received with any changes in life are to take it one  step at a time, that even means if all you can focus on is the next 60 seconds, then do that and celebrate your victories.  With any changes in life, you will have set backs, and that is OK. The important thing to remember is, if you have a set back, it is not a failure, it is an opportunity to try again!  Health Maintenance Recommendations Screening Testing Mammogram Every 1 -2 years based on history and risk factors Starting at age 7 Pap Smear Ages 21-39 every 3 years Ages 60-65 every 5 years with HPV testing More frequent testing may be required based on results and history Colon Cancer Screening Every 1-10 years based on test performed, risk factors, and history Starting at age 66 Bone Density Screening Every 2-10 years based on history Starting at age 38 for women Recommendations for men differ based on medication usage, history, and risk factors AAA Screening One time ultrasound Men 82-66 years old who have every smoked Lung Cancer Screening Low Dose Lung CT every 12 months Age 62-80 years with a 30 pack-year smoking history who still smoke or who have quit within the last 15 years  Screening Labs Routine  Labs: Complete Blood Count (CBC),  Complete Metabolic Panel (CMP), Cholesterol (Lipid Panel) °Every 6-12 months based on history and medications °May be recommended more frequently based on current conditions or previous results °Hemoglobin A1c Lab °Every 3-12 months based on history and previous results °Starting at age 45 or earlier with diagnosis of diabetes, high cholesterol, BMI >26, and/or risk factors °Frequent monitoring for patients with diabetes to ensure blood sugar control °Thyroid Panel (TSH w/ T3 & T4) °Every 6 months based on history, symptoms, and risk factors °May be repeated more often if on medication °HIV °One time testing for all patients 13 and older °May be repeated more frequently for patients with increased risk factors or exposure °Hepatitis C °One time  testing for all patients 18 and older °May be repeated more frequently for patients with increased risk factors or exposure °Gonorrhea, Chlamydia °Every 12 months for all sexually active persons 13-24 years °Additional monitoring may be recommended for those who are considered high risk or who have symptoms °PSA °Men 40-54 years old with risk factors °Additional screening may be recommended from age 55-69 based on risk factors, symptoms, and history ° °Vaccine Recommendations °Tetanus Booster °All adults every 10 years °Flu Vaccine °All patients 6 months and older every year °COVID Vaccine °All patients 12 years and older °Initial dosing with booster °May recommend additional booster based on age and health history °HPV Vaccine °2 doses all patients age 9-26 °Dosing may be considered for patients over 26 °Shingles Vaccine (Shingrix) °2 doses all adults 55 years and older °Pneumonia (Pneumovax 23) °All adults 65 years and older °May recommend earlier dosing based on health history °Pneumonia (Prevnar 13) °All adults 65 years and older °Dosed 1 year after Pneumovax 23 ° °Additional Screening, Testing, and Vaccinations may be recommended on an individualized basis based on family history, health history, risk factors, and/or exposure.  ° ° ° ° ° ° ° ° ° ° ° ° ° ° °

## 2021-05-17 LAB — CBC WITH DIFFERENTIAL/PLATELET
Basophils Absolute: 0.1 10*3/uL (ref 0.0–0.2)
Basos: 1 %
EOS (ABSOLUTE): 0.3 10*3/uL (ref 0.0–0.4)
Eos: 3 %
Hematocrit: 43.2 % (ref 34.0–46.6)
Hemoglobin: 14.8 g/dL (ref 11.1–15.9)
Immature Grans (Abs): 0 10*3/uL (ref 0.0–0.1)
Immature Granulocytes: 0 %
Lymphocytes Absolute: 2.5 10*3/uL (ref 0.7–3.1)
Lymphs: 29 %
MCH: 31 pg (ref 26.6–33.0)
MCHC: 34.3 g/dL (ref 31.5–35.7)
MCV: 91 fL (ref 79–97)
Monocytes Absolute: 0.7 10*3/uL (ref 0.1–0.9)
Monocytes: 8 %
Neutrophils Absolute: 5.1 10*3/uL (ref 1.4–7.0)
Neutrophils: 59 %
Platelets: 327 10*3/uL (ref 150–450)
RBC: 4.77 x10E6/uL (ref 3.77–5.28)
RDW: 13 % (ref 11.7–15.4)
WBC: 8.6 10*3/uL (ref 3.4–10.8)

## 2021-05-17 LAB — COMPREHENSIVE METABOLIC PANEL
ALT: 13 IU/L (ref 0–32)
AST: 15 IU/L (ref 0–40)
Albumin/Globulin Ratio: 2.1 (ref 1.2–2.2)
Albumin: 4.9 g/dL — ABNORMAL HIGH (ref 3.8–4.8)
Alkaline Phosphatase: 82 IU/L (ref 44–121)
BUN/Creatinine Ratio: 10 (ref 9–23)
BUN: 9 mg/dL (ref 6–24)
Bilirubin Total: 0.6 mg/dL (ref 0.0–1.2)
CO2: 22 mmol/L (ref 20–29)
Calcium: 10.3 mg/dL — ABNORMAL HIGH (ref 8.7–10.2)
Chloride: 100 mmol/L (ref 96–106)
Creatinine, Ser: 0.87 mg/dL (ref 0.57–1.00)
Globulin, Total: 2.3 g/dL (ref 1.5–4.5)
Glucose: 86 mg/dL (ref 70–99)
Potassium: 4.3 mmol/L (ref 3.5–5.2)
Sodium: 137 mmol/L (ref 134–144)
Total Protein: 7.2 g/dL (ref 6.0–8.5)
eGFR: 85 mL/min/{1.73_m2} (ref >59)

## 2021-05-17 LAB — LIPID PANEL
Chol/HDL Ratio: 2.8 ratio (ref 0.0–4.4)
Cholesterol, Total: 229 mg/dL — ABNORMAL HIGH (ref 100–199)
HDL: 82 mg/dL (ref >39)
LDL Chol Calc (NIH): 131 mg/dL — ABNORMAL HIGH (ref 0–99)
Triglycerides: 95 mg/dL (ref 0–149)
VLDL Cholesterol Cal: 16 mg/dL (ref 5–40)

## 2021-05-17 LAB — T4: T4, Total: 9.3 ug/dL (ref 4.5–12.0)

## 2021-05-17 LAB — T3: T3, Total: 137 ng/dL (ref 71–180)

## 2021-05-17 LAB — VITAMIN D 25 HYDROXY (VIT D DEFICIENCY, FRACTURES): Vit D, 25-Hydroxy: 45.2 ng/mL (ref 30.0–100.0)

## 2021-05-17 LAB — HEMOGLOBIN A1C
Est. average glucose Bld gHb Est-mCnc: 100 mg/dL
Hgb A1c MFr Bld: 5.1 % (ref 4.8–5.6)

## 2021-05-17 LAB — TSH: TSH: 2.1 u[IU]/mL (ref 0.450–4.500)

## 2021-05-20 ENCOUNTER — Other Ambulatory Visit (HOSPITAL_COMMUNITY): Payer: Self-pay

## 2021-05-20 DIAGNOSIS — R110 Nausea: Secondary | ICD-10-CM

## 2021-05-20 HISTORY — DX: Nausea: R11.0

## 2021-05-20 NOTE — Assessment & Plan Note (Signed)
PRN zofran provided for [facility] ?

## 2021-05-20 NOTE — Assessment & Plan Note (Signed)
CPE today with no abnormal findings.  ?UTD on HM activities. Sees GYN for pap- need records to update chart. ?Will obtain labs today for evaluation.  ?No concerns.  ? ?

## 2021-05-20 NOTE — Assessment & Plan Note (Signed)
Discussed weight management option with [id number]. Patient is interested in trying this at this time.  ?Will obtain baseline labs today. ?Recommend small portions and increased water intake while on medication. Avoid meals high in carbohydrates and fat as these can precipitate nausea and vomiting. Notify immediately for severe abdominal pain or vomiting.  ?May take colace or magnesium at bedtime for issues with constipation. Will send zofran for mild nausea symptoms.  ?Recommend f/u in 3 months with phone visit or sooner if needed.  ?

## 2021-05-30 ENCOUNTER — Ambulatory Visit: Payer: 59 | Admitting: Family Medicine

## 2021-05-30 DIAGNOSIS — D225 Melanocytic nevi of trunk: Secondary | ICD-10-CM | POA: Diagnosis not present

## 2021-05-30 DIAGNOSIS — D2271 Melanocytic nevi of right lower limb, including hip: Secondary | ICD-10-CM | POA: Diagnosis not present

## 2021-05-30 DIAGNOSIS — D2272 Melanocytic nevi of left lower limb, including hip: Secondary | ICD-10-CM | POA: Diagnosis not present

## 2021-05-30 DIAGNOSIS — D2261 Melanocytic nevi of right upper limb, including shoulder: Secondary | ICD-10-CM | POA: Diagnosis not present

## 2021-05-30 DIAGNOSIS — D2262 Melanocytic nevi of left upper limb, including shoulder: Secondary | ICD-10-CM | POA: Diagnosis not present

## 2021-05-30 DIAGNOSIS — L814 Other melanin hyperpigmentation: Secondary | ICD-10-CM | POA: Diagnosis not present

## 2021-05-30 DIAGNOSIS — L821 Other seborrheic keratosis: Secondary | ICD-10-CM | POA: Diagnosis not present

## 2021-05-30 DIAGNOSIS — L820 Inflamed seborrheic keratosis: Secondary | ICD-10-CM | POA: Diagnosis not present

## 2021-05-30 DIAGNOSIS — D1801 Hemangioma of skin and subcutaneous tissue: Secondary | ICD-10-CM | POA: Diagnosis not present

## 2021-05-31 ENCOUNTER — Other Ambulatory Visit (HOSPITAL_COMMUNITY): Payer: Self-pay

## 2021-06-05 ENCOUNTER — Other Ambulatory Visit (HOSPITAL_COMMUNITY): Payer: Self-pay

## 2021-06-11 ENCOUNTER — Telehealth (HOSPITAL_BASED_OUTPATIENT_CLINIC_OR_DEPARTMENT_OTHER): Payer: Self-pay

## 2021-06-11 NOTE — Telephone Encounter (Signed)
Received med[employer] is approved for Wegovy 0.'5mg'$  Prior auth reference number [id number] ?

## 2021-06-20 ENCOUNTER — Encounter: Payer: Self-pay | Admitting: Family Medicine

## 2021-07-10 ENCOUNTER — Other Ambulatory Visit: Payer: Self-pay | Admitting: Nurse Practitioner

## 2021-07-10 DIAGNOSIS — Z1231 Encounter for screening mammogram for malignant neoplasm of breast: Secondary | ICD-10-CM

## 2021-07-11 ENCOUNTER — Ambulatory Visit
Admission: RE | Admit: 2021-07-11 | Discharge: 2021-07-11 | Disposition: A | Discharge status: Home / Self Care | Payer: 59 | Source: Ambulatory Visit | Attending: Nurse Practitioner | Admitting: Nurse Practitioner

## 2021-07-11 DIAGNOSIS — Z1231 Encounter for screening mammogram for malignant neoplasm of breast: Secondary | ICD-10-CM | POA: Diagnosis not present

## 2021-08-14 ENCOUNTER — Other Ambulatory Visit (HOSPITAL_COMMUNITY): Payer: Self-pay

## 2021-08-16 ENCOUNTER — Telehealth (HOSPITAL_BASED_OUTPATIENT_CLINIC_OR_DEPARTMENT_OTHER): Payer: 59 | Admitting: Nurse Practitioner

## 2021-08-22 ENCOUNTER — Other Ambulatory Visit (HOSPITAL_COMMUNITY): Payer: Self-pay

## 2021-08-29 ENCOUNTER — Other Ambulatory Visit (HOSPITAL_COMMUNITY): Payer: Self-pay

## 2021-09-18 ENCOUNTER — Ambulatory Visit: Payer: 59 | Admitting: Family Medicine

## 2021-09-18 ENCOUNTER — Encounter: Payer: Self-pay | Admitting: Family Medicine

## 2021-09-18 ENCOUNTER — Ambulatory Visit: Payer: Self-pay

## 2021-09-18 ENCOUNTER — Other Ambulatory Visit (HOSPITAL_COMMUNITY): Payer: Self-pay

## 2021-09-18 ENCOUNTER — Ambulatory Visit (INDEPENDENT_AMBULATORY_CARE_PROVIDER_SITE_OTHER): Payer: 59

## 2021-09-18 VITALS — BP 116/74 | HR 89 | Ht 61.0 in

## 2021-09-18 DIAGNOSIS — S52552A Other extraarticular fracture of lower end of left radius, initial encounter for closed fracture: Secondary | ICD-10-CM | POA: Diagnosis not present

## 2021-09-18 DIAGNOSIS — M25532 Pain in left wrist: Secondary | ICD-10-CM | POA: Diagnosis not present

## 2021-09-18 DIAGNOSIS — S52502A Unspecified fracture of the lower end of left radius, initial encounter for closed fracture: Secondary | ICD-10-CM | POA: Insufficient documentation

## 2021-09-18 HISTORY — DX: Unspecified fracture of the lower end of left radius, initial encounter for closed fracture: S52.502A

## 2021-09-18 MED ORDER — IBUPROFEN 600 MG PO TABS
600.0000 mg | ORAL_TABLET | Freq: Three times a day (TID) | ORAL | 0 refills | Status: DC | PRN
Start: 2021-09-18 — End: 2022-02-18
  Filled 2021-09-18: qty 90, 30d supply, fill #0

## 2021-09-18 NOTE — Progress Notes (Signed)
[facility] [name] [address] [place] Phone: [phone] Subjective:   Kelsey [name], am serving as a scribe for Dr. Hulan Saas.  I'm seeing this patient by the request  of:  Early, Coralee Pesa, NP  CC: left wrist pain   EXN:[id number]  Kelsey [name] is a 41 y.o. female coming in with complaint of left wrist pain. With in the past month increased pain. Pain at the base of thumb. No other complaints. Patient states that it is worsening and starting to affect daily activities at this time.  Patient has noticed pain with driving at this time.      Past Medical History:  Diagnosis Date   Headache(784.0)    Overweight (BMI 25.0-29.9) 01/23/2018   PONV (postoperative nausea and vomiting)    Pregnancy induced hypertension    Past Surgical History:  Procedure Laterality Date   CESAREAN SECTION  2008, 2013   CHOLECYSTECTOMY  12/2013   DILATION AND EVACUATION  10/04/2010   Procedure: DILATATION AND EVACUATION (D&E);  Surgeon: Marylynn Pearson;  Location: [facility];  Service: Gynecology;  Laterality: N/A;  pt last ate at 8:30am   [place]  2006   Social History   Socioeconomic History   Marital status: Married    Spouse name: Not on file   Number of children: Not on file   Years of education: Not on file   Highest education level: Not on file  Occupational History   Not on file  Tobacco Use   Smoking status: Never   Smokeless tobacco: Never  Substance and Sexual Activity   Alcohol use: No   Drug use: No   Sexual activity: Yes  Other Topics Concern   Not on file  Social History Narrative   Not on file   Social Determinants of Health   Financial Resource Strain: Not on file  Food Insecurity: Not on file  Transportation Needs: Not on file  Physical Activity: Not on file  Stress: Not on file  Social Connections: Not on file   Allergies  Allergen Reactions   Sulfa Antibiotics Rash   Sulfonamide Derivatives Rash    Family History  Problem Relation Age of Onset   Cancer Mother    Hypertension Father    Alcohol abuse Father    Heart disease Father          Current Outpatient Medications (Other):    ondansetron (ZOFRAN) 8 MG tablet, Take 1 tablet (8 mg total) by mouth every 8 (eight) hours as needed for nausea or vomiting.   scopolamine (TRANSDERM-SCOP, 1.5 MG,) 1 MG/3DAYS, Place 1 patch (1.5 mg total) onto the skin every 3 (three) days.   traZODone (DESYREL) 50 MG tablet, TAKE 1/2-1 TABLET (25-50 MG TOTAL) BY MOUTH AT BEDTIME AS NEEDED FOR SLEEP.   Reviewed prior external information including notes and imaging from  primary care provider As well as notes that were available from care everywhere and other healthcare systems.  Past medical history, social, surgical and family history all reviewed in electronic medical record.  No pertanent information unless stated regarding to the chief complaint.   Review of Systems:  No headache, visual changes, nausea, vomiting, diarrhea, constipation, dizziness, abdominal pain, skin rash, fevers, chills, night sweats, weight loss, swollen lymph nodes, body aches, joint swelling, chest pain, shortness of breath, mood changes. POSITIVE muscle aches  Objective  Blood pressure 116/74, pulse 89, height '5\' 1"'$  (1.549 m), SpO2 98 %, unknown if currently  breastfeeding.   General: No apparent distress alert and oriented x3 mood and affect normal, dressed appropriately.  HEENT: Pupils equal, extraocular movements intact  Respiratory: Patient's speak in full sentences and does not appear short of breath  Cardiovascular: No lower extremity edema, non tender, no erythema  Wrist exam shows patient does have some tenderness to palpation mostly over the dorsal radius.  Minimal pain over the anatomical snuffbox.  Negative [facility] grind test.  Patient still has some mild positive on the skin patient does have some lipodystrophy that seems to be improving slowly.  Limited  muscular skeletal ultrasound was performed and interpreted by Hulan Saas, M  Limited ultrasound shows the patient does have a cortical defect over the distal aspect of the radius.  Nondisplaced.  Increasing neovascularization in the area.  No masses.  Small amount of calcific changes noted. Impression: Cortical irregularity of the distal radius    Impression and Recommendations:    The above documentation has been reviewed and is accurate and complete Lyndal Pulley, DO

## 2021-09-18 NOTE — Patient Instructions (Signed)
K2 240mg daily Vit D double up 4000IU See me in 1 month

## 2021-09-18 NOTE — Assessment & Plan Note (Addendum)
Patient has had closed cortical irregularity noted on the distal radius.  Patient's scaphoid appears to be unremarkable.  On x-rays do not show anything but on overall ultrasound there is some increase in neovascularization in the area.  Patient does have some very mild callus formation noted in the area.  We will put patient in exos brace and we will see what could possibly be done.  Follow-up againin 3-4 wks

## 2021-10-02 ENCOUNTER — Ambulatory Visit: Payer: 59 | Admitting: Family Medicine

## 2021-10-22 NOTE — Progress Notes (Unsigned)
Kelsey [name] [place] [address] [place] Phone: [phone] Subjective:   Kelsey [name], am serving as a scribe for Dr. Hulan Saas.  I'm seeing this patient by the request  of:  Early, Coralee Pesa, NP  CC: Left wrist pain follow-up  [id number]  09/18/2021 Patient has had closed cortical irregularity noted on the distal radius.  Patient's scaphoid appears to be unremarkable.  On x-rays do not show anything but on overall ultrasound there is some increase in neovascularization in the area.  Patient does have some very mild callus formation noted in the area.  We will put patient in exos brace and we will see what could possibly be done.  Follow-up againin 3-4 wks   Update 10/23/2021 Kelsey [name] is a 42 y.o. female coming in with complaint of L wrist pain.  Patient is a training session some mild stable arthritic changes of the wrist but otherwise fairly unremarkable.  Was found to have a 2 mm ulnar negative variance noted.  Patient was placed in an Exos splint.  Patient states that she has some tenderness over R CMC joint with palpation.      Past Medical History:  Diagnosis Date   Headache(784.0)    Overweight (BMI 25.0-29.9) 01/23/2018   PONV (postoperative nausea and vomiting)    Pregnancy induced hypertension    Past Surgical History:  Procedure Laterality Date   CESAREAN SECTION  2008, 2013   CHOLECYSTECTOMY  12/2013   DILATION AND EVACUATION  10/04/2010   Procedure: DILATATION AND EVACUATION (D&E);  Surgeon: Marylynn Pearson;  Location: [facility];  Service: Gynecology;  Laterality: N/A;  pt last ate at 8:30am   [place]  2006   Social History   Socioeconomic History   Marital status: Married    Spouse name: Not on file   Number of children: Not on file   Years of education: Not on file   Highest education level: Not on file  Occupational History   Not on file  Tobacco Use   Smoking status: Never   Smokeless  tobacco: Never  Substance and Sexual Activity   Alcohol use: No   Drug use: No   Sexual activity: Yes  Other Topics Concern   Not on file  Social History Narrative   Not on file   Social Determinants of Health   Financial Resource Strain: Not on file  Food Insecurity: Not on file  Transportation Needs: Not on file  Physical Activity: Not on file  Stress: Not on file  Social Connections: Not on file   Allergies  Allergen Reactions   Sulfa Antibiotics Rash   Sulfonamide Derivatives Rash   Family History  Problem Relation Age of Onset   Cancer Mother    Hypertension Father    Alcohol abuse Father    Heart disease Father        Current Outpatient Medications (Analgesics):    ibuprofen (ADVIL) 600 MG tablet, Take 1 tablet (600 mg total) by mouth every 8 (eight) hours as needed.   Current Outpatient Medications (Other):    ondansetron (ZOFRAN) 8 MG tablet, Take 1 tablet (8 mg total) by mouth every 8 (eight) hours as needed for nausea or vomiting.   scopolamine (TRANSDERM-SCOP, 1.5 MG,) 1 MG/3DAYS, Place 1 patch (1.5 mg total) onto the skin every 3 (three) days.   traZODone (DESYREL) 50 MG tablet, TAKE 1/2-1 TABLET (25-50 MG TOTAL) BY MOUTH AT BEDTIME AS NEEDED FOR SLEEP.  Reviewed prior external information including notes and imaging from  primary care provider As well as notes that were available from care everywhere and other healthcare systems.  Past medical history, social, surgical and family history all reviewed in electronic medical record.  No pertanent information unless stated regarding to the chief complaint.   Review of Systems:  No headache, visual changes, nausea, vomiting, diarrhea, constipation, dizziness, abdominal pain, skin rash, fevers, chills, night sweats, weight loss, swollen lymph nodes, body aches, joint swelling, chest pain, shortness of breath, mood changes. POSITIVE muscle aches  Objective  Blood pressure 106/64, pulse 81, height '5\' 1"'$   (1.549 m), SpO2 98 %, unknown if currently breastfeeding.   General: No apparent distress alert and oriented x3 mood and affect normal, dressed appropriately.  HEENT: Pupils equal, extraocular movements intact  Respiratory: Patient's speak in full sentences and does not appear short of breath  Cardiovascular: No lower extremity edema, non tender, no erythema  Wrist exam shows trace effusion noted.  Tender to palpation over the dorsal aspect of the wrist.  Good grip strength noted.  No longer tender over the scaphoid bone.  Limited muscular skeletal ultrasound was performed and interpreted by Hulan Saas, M  Limited ultrasound shows that there is hypoechoic changes of the TFCC.  No hypoechoic changes over the scaphoid lunate ligament.  Mild narrowing of the joint space.  Scaphoid bone itself appears to be unremarkable. Impression: Interval improvement but still some hypoechoic changes over the TFCC     Impression and Recommendations:     The above documentation has been reviewed and is accurate and complete Lyndal Pulley, DO

## 2021-10-23 ENCOUNTER — Other Ambulatory Visit: Payer: Self-pay

## 2021-10-23 ENCOUNTER — Ambulatory Visit (INDEPENDENT_AMBULATORY_CARE_PROVIDER_SITE_OTHER): Payer: 59 | Admitting: Family Medicine

## 2021-10-23 ENCOUNTER — Ambulatory Visit: Payer: Self-pay

## 2021-10-23 ENCOUNTER — Encounter: Payer: Self-pay | Admitting: Family Medicine

## 2021-10-23 VITALS — BP 106/64 | HR 81 | Ht 61.0 in

## 2021-10-23 DIAGNOSIS — S52552A Other extraarticular fracture of lower end of left radius, initial encounter for closed fracture: Secondary | ICD-10-CM

## 2021-10-23 DIAGNOSIS — M25532 Pain in left wrist: Secondary | ICD-10-CM

## 2021-10-23 NOTE — Assessment & Plan Note (Signed)
Patient's distal radius appears to be well healed at this point and there was a concern for more of a scapular mets seems to be improved patient will start with range of motion.  Worsening pain will consider injection or if for significant worsening we will need to consider MRI but do not think it will change medical management at this time.

## 2021-11-22 ENCOUNTER — Other Ambulatory Visit (HOSPITAL_COMMUNITY): Payer: Self-pay

## 2021-12-19 ENCOUNTER — Ambulatory Visit: Payer: 59 | Admitting: Family Medicine

## 2022-01-17 DIAGNOSIS — G514 Facial myokymia: Secondary | ICD-10-CM | POA: Diagnosis not present

## 2022-02-18 ENCOUNTER — Other Ambulatory Visit (HOSPITAL_COMMUNITY): Payer: Self-pay

## 2022-02-18 ENCOUNTER — Encounter: Payer: Self-pay | Admitting: Nurse Practitioner

## 2022-02-18 ENCOUNTER — Ambulatory Visit: Payer: 59 | Admitting: Nurse Practitioner

## 2022-02-18 VITALS — BP 124/80 | HR 68 | Temp 98.3°F | Wt 183.4 lb

## 2022-02-18 DIAGNOSIS — Z6834 Body mass index (BMI) 34.0-34.9, adult: Secondary | ICD-10-CM | POA: Diagnosis not present

## 2022-02-18 DIAGNOSIS — F32A Depression, unspecified: Secondary | ICD-10-CM

## 2022-02-18 DIAGNOSIS — F5101 Primary insomnia: Secondary | ICD-10-CM

## 2022-02-18 MED ORDER — PHENTERMINE HCL 37.5 MG PO TABS
37.5000 mg | ORAL_TABLET | Freq: Every day | ORAL | 0 refills | Status: DC
  Filled 2022-02-18 – 2022-03-24 (×2): qty 30, 30d supply, fill #0

## 2022-02-18 MED ORDER — TRAZODONE HCL 50 MG PO TABS
25.0000 mg | ORAL_TABLET | Freq: Every evening | ORAL | 3 refills | Status: DC | PRN
  Filled 2022-02-18: qty 90, 90d supply, fill #0
  Filled 2022-06-03: qty 90, 90d supply, fill #1
  Filled 2022-09-29: qty 90, 90d supply, fill #2
  Filled 2023-01-06: qty 90, 90d supply, fill #3

## 2022-02-18 MED ORDER — PHENTERMINE HCL 37.5 MG PO TABS
37.5000 mg | ORAL_TABLET | Freq: Every day | ORAL | 0 refills | Status: DC
  Filled 2022-02-18: qty 30, 30d supply, fill #0

## 2022-02-18 MED ORDER — BUPROPION HCL ER (SR) 150 MG PO TB12
150.0000 mg | ORAL_TABLET | Freq: Two times a day (BID) | ORAL | 0 refills | Status: DC
  Filled 2022-02-18: qty 60, 30d supply, fill #0

## 2022-02-18 MED ORDER — WEGOVY 0.25 MG/0.5ML ~~LOC~~ SOAJ
0.2500 mg | SUBCUTANEOUS | 0 refills | Status: DC
  Filled 2022-02-18: qty 2, 28d supply, fill #0

## 2022-02-18 NOTE — Progress Notes (Signed)
Kelsey Keeler, DNP, AGNP-c [facility]  [address] [place], [place] [address] [phone]  ESTABLISHED PATIENT- Chronic Health and/or Follow-Up Visit  Blood pressure 124/80, pulse 68, temperature 98.3 F (36.8 C), weight 183 lb 6.4 oz (83.2 kg), unknown if currently breastfeeding.    Kelsey [name] is a 41 y.o. year old female presenting today for evaluation and management of the following: Insomnia Kelsey [name] endorses good control of her insomnia with trazodone 25-'50mg'$  at bedtime as needed. She reports she is sleeping well through the night and denies daytime fatigue. She feels the medication is working well for her and she would like to continue.   Feeling Down [place] expresses that she has been more emotional over the past two months with feelings of depression and at times "just not wanting to leave the house". She reports that she has never felt like this in the past. She is unsure of what has triggered the change in her emotional state. She recently has received a promotion and is excited about the opportunities this brings. She tells me she is very busy, but it is all with good things. She is helping to care for her father-in-law who recently completed treatment for lung cancer. She has never taken medication for mood in the past, but is agreeable to trial something.   Weight Kelsey [name] reports she is really struggling with her weight. She has been monitoring her diet closely and working out routinely, but she has not had any success with weight loss. She feels that this may be a strong factor in her mood changes as she feels defeated over her lack of change despite her efforts. She has tried phentermine in the past and was able to loose weight and keep the weight off for quite a long period of time. She is interested in [place], but had difficulty getting approval previously.   All ROS negative with exception of what is listed above.   PHYSICAL EXAM Physical Exam Vitals  and nursing note reviewed.  Constitutional:      Appearance: Normal appearance.  HENT:     Head: Normocephalic.  Eyes:     Pupils: Pupils are equal, round, and reactive to light.  Cardiovascular:     Rate and Rhythm: Normal rate.  Pulmonary:     Effort: Pulmonary effort is normal.  Skin:    General: Skin is warm and dry.     Capillary Refill: Capillary refill takes less than 2 seconds.  Neurological:     General: No focal deficit present.     Mental Status: She is alert and oriented to person, place, and time.  Psychiatric:        Attention and Perception: Attention normal.        Mood and Affect: Mood normal. Affect is tearful.        Speech: Speech normal.        Behavior: Behavior normal. Behavior is cooperative.        Thought Content: Thought content normal.        Judgment: Judgment normal.     PLAN Problem List Items Addressed This Visit     BMI 34.0-34.9,adult - Primary    Recent weight gain despite routine exercise and diet. She has utilized phentermine in the past and had success with weight loss and maintaining, therefore we will plan to start this today. We discussed the option of Wegovy and I do feel that would be beneficial for her. Due to the shortage, we will place the  order now and work to get approval. Once she is able to get the medication, I recommend waiting until she has the first and second months dosing on hand before starting to prevent start and stop if she is unable to get the 0.'5mg'$  dose. We will monitor her progress in 3 months or sooner if needed.       Relevant Medications   phentermine (ADIPEX-P) 37.5 MG tablet   phentermine (ADIPEX-P) 37.5 MG tablet (Start on 03/18/2022)   phentermine (ADIPEX-P) 37.5 MG tablet (Start on 04/15/2022)   Semaglutide-Weight Management (WEGOVY) 0.25 MG/0.5ML SOAJ   Depressive episode    Symptoms and presentation consistent with depressive symptoms. This is a new finding for her and likely related to increased stressors  and seasonal changes that are present. No alarm symptoms present. She is interested in weight loss, therefore, joint decision to trial wellbutrin to see if this is helpful for management of symptoms and weight management. We will monitor closely and plan to follow-up in the next 2-4 weeks to see how she is tolerating the medication. If symptoms well controlled, will plan to transition to the once daily formulation.       Relevant Medications   buPROPion (WELLBUTRIN SR) 150 MG 12 hr tablet   Primary insomnia    Chronic. Well controlled on trazodone 25-'50mg'$  qHS PRN. No alarm sx present. Refills sent to pharmacy.       Relevant Medications   traZODone (DESYREL) 50 MG tablet    Return in about 6 months (around 08/20/2022) for Weight.   Kelsey Keeler, DNP, AGNP-c 02/18/2022  8:14 AM

## 2022-02-18 NOTE — Patient Instructions (Addendum)
I have sent in the phentermine for you for the next 3 months. We will see how you do with this. I have sent in the wegovy for you. We will work on getting authorization for this.   We will plan to touch base in about 2-3 weeks to see how you are doing on the wellbutrin. If you have any concerns please call me.

## 2022-02-18 NOTE — Assessment & Plan Note (Signed)
Chronic. Well controlled on trazodone 25-'50mg'$  qHS PRN. No alarm sx present. Refills sent to pharmacy.

## 2022-02-21 NOTE — Assessment & Plan Note (Signed)
Symptoms and presentation consistent with depressive symptoms. This is a new finding for her and likely related to increased stressors and seasonal changes that are present. No alarm symptoms present. She is interested in weight loss, therefore, joint decision to trial wellbutrin to see if this is helpful for management of symptoms and weight management. We will monitor closely and plan to follow-up in the next 2-4 weeks to see how she is tolerating the medication. If symptoms well controlled, will plan to transition to the once daily formulation.

## 2022-02-21 NOTE — Assessment & Plan Note (Signed)
Recent weight gain despite routine exercise and diet. She has utilized phentermine in the past and had success with weight loss and maintaining, therefore we will plan to start this today. We discussed the option of Wegovy and I do feel that would be beneficial for her. Due to the shortage, we will place the order now and work to get approval. Once she is able to get the medication, I recommend waiting until she has the first and second months dosing on hand before starting to prevent start and stop if she is unable to get the 0.'5mg'$  dose. We will monitor her progress in 3 months or sooner if needed.

## 2022-03-11 DIAGNOSIS — G514 Facial myokymia: Secondary | ICD-10-CM | POA: Diagnosis not present

## 2022-03-11 DIAGNOSIS — H5213 Myopia, bilateral: Secondary | ICD-10-CM | POA: Diagnosis not present

## 2022-03-11 DIAGNOSIS — H52223 Regular astigmatism, bilateral: Secondary | ICD-10-CM | POA: Diagnosis not present

## 2022-03-11 DIAGNOSIS — D3132 Benign neoplasm of left choroid: Secondary | ICD-10-CM | POA: Diagnosis not present

## 2022-03-11 DIAGNOSIS — H524 Presbyopia: Secondary | ICD-10-CM | POA: Diagnosis not present

## 2022-03-13 ENCOUNTER — Encounter: Payer: Self-pay | Admitting: Nurse Practitioner

## 2022-03-13 DIAGNOSIS — F32A Depression, unspecified: Secondary | ICD-10-CM

## 2022-03-13 MED ORDER — BUPROPION HCL ER (XL) 150 MG PO TB24
150.0000 mg | ORAL_TABLET | Freq: Every day | ORAL | 3 refills | Status: DC
  Filled 2022-03-13: qty 90, 90d supply, fill #0
  Filled 2022-06-16 – 2022-07-10 (×2): qty 90, 90d supply, fill #1
  Filled 2022-09-29: qty 90, 90d supply, fill #2
  Filled 2023-01-06: qty 90, 90d supply, fill #3

## 2022-03-14 ENCOUNTER — Other Ambulatory Visit (HOSPITAL_COMMUNITY): Payer: Self-pay

## 2022-03-21 ENCOUNTER — Encounter: Payer: Self-pay | Admitting: Nurse Practitioner

## 2022-03-24 ENCOUNTER — Other Ambulatory Visit: Payer: Self-pay

## 2022-03-24 ENCOUNTER — Other Ambulatory Visit (HOSPITAL_COMMUNITY): Payer: Self-pay

## 2022-03-28 ENCOUNTER — Other Ambulatory Visit (HOSPITAL_COMMUNITY): Payer: Self-pay

## 2022-03-29 ENCOUNTER — Telehealth: Payer: Self-pay | Admitting: Nurse Practitioner

## 2022-03-29 NOTE — Telephone Encounter (Signed)
P.A. WEGOVY unable to complete, insurance info not valid, no copy of card, states pt not found. Sent pt Mychart message

## 2022-03-31 ENCOUNTER — Other Ambulatory Visit (HOSPITAL_COMMUNITY): Payer: Self-pay

## 2022-04-01 ENCOUNTER — Other Ambulatory Visit (HOSPITAL_COMMUNITY): Payer: Self-pay

## 2022-04-03 DIAGNOSIS — D3132 Benign neoplasm of left choroid: Secondary | ICD-10-CM | POA: Diagnosis not present

## 2022-04-03 DIAGNOSIS — H43823 Vitreomacular adhesion, bilateral: Secondary | ICD-10-CM | POA: Diagnosis not present

## 2022-04-03 DIAGNOSIS — H3561 Retinal hemorrhage, right eye: Secondary | ICD-10-CM | POA: Diagnosis not present

## 2022-05-16 ENCOUNTER — Encounter: Payer: Self-pay | Admitting: Nurse Practitioner

## 2022-05-16 ENCOUNTER — Other Ambulatory Visit (HOSPITAL_COMMUNITY): Payer: Self-pay

## 2022-05-16 ENCOUNTER — Ambulatory Visit (INDEPENDENT_AMBULATORY_CARE_PROVIDER_SITE_OTHER): Payer: Commercial Managed Care - PPO | Admitting: Nurse Practitioner

## 2022-05-16 ENCOUNTER — Encounter (HOSPITAL_BASED_OUTPATIENT_CLINIC_OR_DEPARTMENT_OTHER): Payer: 59 | Admitting: Nurse Practitioner

## 2022-05-16 VITALS — BP 118/72 | HR 68 | Ht 62.5 in | Wt 164.0 lb

## 2022-05-16 DIAGNOSIS — R638 Other symptoms and signs concerning food and fluid intake: Secondary | ICD-10-CM

## 2022-05-16 DIAGNOSIS — F32A Depression, unspecified: Secondary | ICD-10-CM | POA: Diagnosis not present

## 2022-05-16 DIAGNOSIS — Z6834 Body mass index (BMI) 34.0-34.9, adult: Secondary | ICD-10-CM

## 2022-05-16 DIAGNOSIS — Z0000 Encounter for general adult medical examination without abnormal findings: Secondary | ICD-10-CM | POA: Diagnosis not present

## 2022-05-16 DIAGNOSIS — E559 Vitamin D deficiency, unspecified: Secondary | ICD-10-CM

## 2022-05-16 DIAGNOSIS — E538 Deficiency of other specified B group vitamins: Secondary | ICD-10-CM

## 2022-05-16 DIAGNOSIS — H579 Unspecified disorder of eye and adnexa: Secondary | ICD-10-CM | POA: Diagnosis not present

## 2022-05-16 LAB — LIPID PANEL

## 2022-05-16 MED ORDER — PHENTERMINE HCL 37.5 MG PO TABS
37.5000 mg | ORAL_TABLET | Freq: Every day | ORAL | 0 refills | Status: DC
  Filled 2022-05-16: qty 30, 30d supply, fill #0

## 2022-05-16 MED ORDER — PHENTERMINE HCL 37.5 MG PO TABS
37.5000 mg | ORAL_TABLET | Freq: Every day | ORAL | 0 refills | Status: DC
  Filled 2022-05-16 – 2022-07-10 (×2): qty 30, 30d supply, fill #0

## 2022-05-16 NOTE — Progress Notes (Unsigned)
Worthy Keeler, DNP, AGNP-c [place], [place] [address] Main Office [phone]  BP 118/72   Pulse 68   Ht 5' 2.5" (1.588 m)   Wt 164 lb (74.4 kg)   LMP 04/25/2022   BMI 29.52 kg/m    Subjective:    Patient ID: Kelsey [name], female    DOB: 02-22-1981, 42 y.o.   MRN: [id number]  HPI: Kelsey [name] is a 42 y.o. female presenting on 05/16/2022 for comprehensive medical examination.   Current medical concerns include: Kelsey [name] presents today feeling much better overall, attributing the improvements in mood and stress management to the current medication regimen, which includes Wellbutrin. Kelsey [name] mentions a history of stress eating, which has seen a notable decrease since starting on Wellbutrin, with a reduction in appetite and compulsive eating behaviors. She has been taking phentermine about every other day and feels this is helping significantly. She expresses that she would like to continue this medication.   Kelsey [name] shares recent issues related to eye health. After experiencing a twitch and undergoing an eye examination, a large freckle was discovered behind the left eye, leading to a referral to a retina specialist. The specialist did not find the freckle alarming but noted some blood behind the right eye, which was new information for Kelsey [name]. Further examination by another ophthalmologist confirmed the presence of the freckle on the left and evidence of a pocket in the retina on the right, suggesting a possible retinopathy without a clear metabolic cause. Kelsey [name] has been advised to return for follow-up with the retinal specialist for re-evaluation given this finding. She will have this information forwarded to me.   IMMUNIZATIONS:   Flu: Flu vaccine completed elsewhere this season Prevnar 13: Prevnar 13 N/A for this patient Prevnar 20: Prevnar 20 N/A for this patient Pneumovax 23: Pneumovax 23 N/A for this patient Vac Shingrix: Shingrix N/A for this  patient HPV: HPV N/A for this patient Tetanus: Tetanus completed in the last 10 years  HEALTH MAINTENANCE: Pap Smear HM Status: is up to date- records needed [facility] Mammogram HM Status: is up to date Colon Cancer Screening HM Status: is up to date Bone Density HM Status: is not applicable for this patient STI Testing HM Status: is not applicable for this patient  She reports regular vision exams q1-5y: Yes  She reports regular dental exams q 7m  No  The patient eats a regular, healthy diet. 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  Most Recent Depression Screen:     05/16/2022    8:09 AM 05/20/2021    7:59 PM 04/27/2018    8:59 AM 08/24/2017    9:59 AM 03/11/2016    9:27 AM  Depression screen PHQ 2/9  Decreased Interest 0 0 0 0 0  Down, Depressed, Hopeless 0 0 0 0 0  PHQ - 2 Score 0 0 0 0 0  Altered sleeping   0    Tired, decreased energy   0    Change in appetite   0    Feeling bad or failure about yourself    0    Trouble concentrating   0    Moving slowly or fidgety/restless   0    Suicidal thoughts   0    PHQ-9 Score   0  Most Recent Anxiety Screen:      No data to display         Most Recent Fall Screen:    05/16/2022    8:09 AM 05/20/2021    7:59 PM 08/24/2017    9:59 AM 03/11/2016    9:27 AM 02/12/2016    8:53 AM  Fall Risk   Falls in the past year? 0 0 No No No  Number falls in past yr: 0 0     Injury with Fall? 0 0     Risk for fall due to : No Fall Risks No Fall Risks     Follow up Falls evaluation completed Falls evaluation completed       Past medical history, surgical history, medications, allergies, family history and social history reviewed with patient today and changes made to appropriate areas of the chart.  Past Medical History:  Past Medical  History:  Diagnosis Date   Closed fracture of left distal radius 09/18/2021   Closed patellar dislocation, right, sequela 01/28/2016   Injury was on 01/23/2016 when she just turned while standing at a computer.   Headache(784.0)    Left knee pain 02/20/2016   She has mildly abnormal tracking and genu valgus.   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   traZODone (DESYREL) 50 MG tablet Take 1/2-1 tablet (25-50 mg total) by mouth at bedtime as needed for sleep.   No current facility-administered medications on file prior to visit.   Surgical History:  Past Surgical History:  Procedure Laterality Date   CESAREAN SECTION  2008, 2013   CHOLECYSTECTOMY  12/2013   DILATION AND EVACUATION  10/04/2010   Procedure: DILATATION AND EVACUATION (D&E);  Surgeon: Marylynn Pearson;  Location: [facility];  Service: Gynecology;  Laterality: N/A;  pt last ate at 8:30am   WISDOM TOOTH EXTRACTION  2006   Allergies:  Allergies  Allergen Reactions   Sulfa Antibiotics Rash   Sulfonamide Derivatives Rash   Family History:  Family History  Problem Relation Age of Onset   Cancer Mother    Hypertension Father    Alcohol abuse Father    Heart disease Father        Objective:    BP 118/72   Pulse 68   Ht 5' 2.5" (1.588 m)   Wt 164 lb (74.4 kg)   LMP 04/25/2022   BMI 29.52 kg/m   Wt Readings from Last 3 Encounters:  05/16/22 164 lb (74.4 kg)  02/18/22 183 lb 6.4 oz (83.2 kg)  05/16/21 173 lb  (78.5 kg)    Physical Exam Vitals and nursing note reviewed.  Constitutional:      General: She is not in acute distress.    Appearance: Normal appearance.  HENT:     Head: Normocephalic and atraumatic.     Right Ear: Hearing, tympanic membrane, ear canal and external ear normal.     Left Ear: Hearing, tympanic membrane, ear canal and external ear normal.  Nose: Nose normal.     Right Sinus: No maxillary sinus tenderness or frontal sinus tenderness.     Left Sinus: No maxillary sinus tenderness or frontal sinus tenderness.     Mouth/Throat:     Lips: Pink.     Mouth: Mucous membranes are moist.     Pharynx: Oropharynx is clear.  Eyes:     General: Lids are normal. Vision grossly intact.     Extraocular Movements: Extraocular movements intact.     Right eye: Nystagmus present.     Conjunctiva/sclera: Conjunctivae normal.     Pupils: Pupils are equal, round, and reactive to light.     Funduscopic exam:    Right eye: Red reflex present.        Left eye: Red reflex present.    Visual Fields: Right eye visual fields normal and left eye visual fields normal.     Comments: No dizziness  Neck:     Thyroid: No thyromegaly.     Vascular: No carotid bruit.  Cardiovascular:     Rate and Rhythm: Normal rate and regular rhythm.     Chest Wall: PMI is not displaced.     Pulses: Normal pulses.          Dorsalis pedis pulses are 2+ on the right side and 2+ on the left side.       Posterior tibial pulses are 2+ on the right side and 2+ on the left side.     Heart sounds: Normal heart sounds. No murmur heard. Pulmonary:     Effort: Pulmonary effort is normal. No respiratory distress.     Breath sounds: Normal breath sounds.  Abdominal:     General: Abdomen is flat. Bowel sounds are normal. There is no distension.     Palpations: Abdomen is soft. There is no hepatomegaly, splenomegaly or mass.     Tenderness: There is no abdominal tenderness. There is no right CVA tenderness, left CVA  tenderness, guarding or rebound.  Musculoskeletal:        General: Normal range of motion.     Cervical back: Full passive range of motion without pain, normal range of motion and neck supple. No tenderness.     Right lower leg: No edema.     Left lower leg: No edema.  Feet:     Left foot:     Toenail Condition: Left toenails are normal.  Lymphadenopathy:     Cervical: No cervical adenopathy.     Upper Body:     Right upper body: No supraclavicular adenopathy.     Left upper body: No supraclavicular adenopathy.  Skin:    General: Skin is warm and dry.     Capillary Refill: Capillary refill takes less than 2 seconds.     Nails: There is no clubbing.  Neurological:     General: No focal deficit present.     Mental Status: She is alert and oriented to person, place, and time.     GCS: GCS eye subscore is 4. GCS verbal subscore is 5. GCS motor subscore is 6.     Sensory: Sensation is intact.     Motor: Motor function is intact.     Coordination: Coordination is intact.     Gait: Gait is intact.     Deep Tendon Reflexes: Reflexes are normal and symmetric.  Psychiatric:        Attention and Perception: Attention normal.        Mood and Affect: Mood normal.  Speech: Speech normal.        Behavior: Behavior normal. Behavior is cooperative.        Thought Content: Thought content normal.        Cognition and Memory: Cognition and memory normal.        Judgment: Judgment normal.     Results for orders placed or performed in visit on 05/16/22  CBC with Differential/Platelet  Result Value Ref Range   WBC 7.0 3.4 - 10.8 x10E3/uL   RBC 4.75 3.77 - 5.28 x10E6/uL   Hemoglobin 14.7 11.1 - 15.9 g/dL   Hematocrit 43.5 34.0 - 46.6 %   MCV 92 79 - 97 fL   MCH 30.9 26.6 - 33.0 pg   MCHC 33.8 31.5 - 35.7 g/dL   RDW 13.2 11.7 - 15.4 %   Platelets 336 150 - 450 x10E3/uL   Neutrophils 55 Not Estab. %   Lymphs 30 Not Estab. %   Monocytes 9 Not Estab. %   Eos 4 Not Estab. %   Basos 2  Not Estab. %   Neutrophils Absolute 3.9 1.4 - 7.0 x10E3/uL   Lymphocytes Absolute 2.1 0.7 - 3.1 x10E3/uL   Monocytes Absolute 0.6 0.1 - 0.9 x10E3/uL   EOS (ABSOLUTE) 0.3 0.0 - 0.4 x10E3/uL   Basophils Absolute 0.1 0.0 - 0.2 x10E3/uL   Immature Granulocytes 0 Not Estab. %   Immature Grans (Abs) 0.0 0.0 - 0.1 x10E3/uL  Comprehensive metabolic panel  Result Value Ref Range   Glucose 81 70 - 99 mg/dL   BUN 8 6 - 24 mg/dL   Creatinine, Ser 0.93 0.57 - 1.00 mg/dL   eGFR 78 >59 mL/min/1.73   BUN/Creatinine Ratio 9 9 - 23   Sodium 139 134 - 144 mmol/L   Potassium 4.3 3.5 - 5.2 mmol/L   Chloride 101 96 - 106 mmol/L   CO2 22 20 - 29 mmol/L   Calcium 10.1 8.7 - 10.2 mg/dL   Total Protein 7.4 6.0 - 8.5 g/dL   Albumin 4.9 3.9 - 4.9 g/dL   Globulin, Total 2.5 1.5 - 4.5 g/dL   Albumin/Globulin Ratio 2.0 1.2 - 2.2   Bilirubin Total 0.6 0.0 - 1.2 mg/dL   Alkaline Phosphatase 93 44 - 121 IU/L   AST 17 0 - 40 IU/L   ALT 17 0 - 32 IU/L  Lipid panel  Result Value Ref Range   Cholesterol, Total 193 100 - 199 mg/dL   Triglycerides 97 0 - 149 mg/dL   HDL 52 >39 mg/dL   VLDL Cholesterol Cal 18 5 - 40 mg/dL   LDL Chol Calc (NIH) 123 (H) 0 - 99 mg/dL   Chol/HDL Ratio 3.7 0.0 - 4.4 ratio  Hemoglobin A1c  Result Value Ref Range   Hgb A1c MFr Bld 5.2 4.8 - 5.6 %   Est. average glucose Bld gHb Est-mCnc 103 mg/dL  TSH Rfx on Abnormal to Free T4  Result Value Ref Range   TSH 2.910 0.450 - 4.500 uIU/mL  VITAMIN D 25 Hydroxy (Vit-D Deficiency, Fractures)  Result Value Ref Range   Vit D, 25-Hydroxy 39.4 30.0 - 100.0 ng/mL  Vitamin B12  Result Value Ref Range   Vitamin B-12 379 232 - 1,245 pg/mL         Assessment & Plan:   Problem List Items Addressed This Visit     B12 deficiency    Will monitor labs today. No alarm symptoms present at this time.      Relevant Orders  CBC with Differential/Platelet (Completed)   Comprehensive metabolic panel (Completed)   Lipid panel (Completed)    Hemoglobin A1c (Completed)   TSH Rfx on Abnormal to Free T4 (Completed)   VITAMIN D 25 Hydroxy (Vit-D Deficiency, Fractures) (Completed)   Vitamin B12 (Completed)   Encounter for annual physical exam - Primary    CPE today with no abnormalities noted on exam.  Labs pending. Will make changes as necessary based on results.  Review of HM activities and recommendations discussed and provided on AVS Anticipatory guidance, diet, and exercise recommendations provided.  Medications, allergies, and hx reviewed and updated as necessary.  Plan to f/u with CPE in 1 year or sooner for acute/chronic health needs as directed.        Relevant Orders   CBC with Differential/Platelet (Completed)   Comprehensive metabolic panel (Completed)   Lipid panel (Completed)   Hemoglobin A1c (Completed)   TSH Rfx on Abnormal to Free T4 (Completed)   VITAMIN D 25 Hydroxy (Vit-D Deficiency, Fractures) (Completed)   Vitamin B12 (Completed)   Depressive episode    Jhournee is doing very well with her mood on current regimen of wellbutrin and trazodone for sleep. She has had stress reduction at work and is not experiencing any concerning symptoms at this time. Wellbutrin has also been beneficial for appetite control and snacking behaviors. Plan: - continue current medications and doses.  - notify if new or worsening symptoms present.       Relevant Orders   CBC with Differential/Platelet (Completed)   Comprehensive metabolic panel (Completed)   Lipid panel (Completed)   Hemoglobin A1c (Completed)   TSH Rfx on Abnormal to Free T4 (Completed)   VITAMIN D 25 Hydroxy (Vit-D Deficiency, Fractures) (Completed)   Vitamin B12 (Completed)   Ophthalmic disorder    Kelsey [name] has experienced a twitch in the eye, leading to the discovery of a large freckle (nevi) behind the left eye and blood behind the right eye, with a recommendation for follow-up. No symptoms present at this time.  Plan: - Schedule a follow-up appointment  with the retina specialist as advised. - Monitor for new symptoms like floaters, sudden vision loss, or a "curtain coming down" over the field of vision, and report immediately to the ophthalmologist. - Review and document specialist reports in Kelsey [name]'s medical record for comprehensive care.      BMI 34.0-34.9,adult    Kelsey [name] has successfully lost 20 pounds since December 12th, leading to a five-point decrease in BMI, attributed to the use of phentermine and improved mood and decreased stress eating, possibly due to Wellbutrin. Plan: - Maintain the current regimen of Wellbutrin for mood and appetite control. - Evaluate the option of resuming phentermine or initiating Wegovy for ongoing weight management in the subsequent visit, considering Kelsey [name]'s preference and clinical guidelines.       Relevant Medications   phentermine (ADIPEX-P) 37.5 MG tablet   phentermine (ADIPEX-P) 37.5 MG tablet (Start on 06/13/2022)   Vitamin D deficiency   Relevant Orders   CBC with Differential/Platelet (Completed)   Comprehensive metabolic panel (Completed)   Lipid panel (Completed)   Hemoglobin A1c (Completed)   TSH Rfx on Abnormal to Free T4 (Completed)   VITAMIN D 25 Hydroxy (Vit-D Deficiency, Fractures) (Completed)   Vitamin B12 (Completed)   Difficulty maintaining weight   Relevant Orders   CBC with Differential/Platelet (Completed)   Comprehensive metabolic panel (Completed)   Lipid panel (Completed)   Hemoglobin A1c (Completed)   TSH Rfx on Abnormal to Free  T4 (Completed)   VITAMIN D 25 Hydroxy (Vit-D Deficiency, Fractures) (Completed)   Vitamin B12 (Completed)   Other Visit Diagnoses     Health care maintenance       Relevant Orders   CBC with Differential/Platelet (Completed)   Comprehensive metabolic panel (Completed)   Lipid panel (Completed)   Hemoglobin A1c (Completed)   TSH Rfx on Abnormal to Free T4 (Completed)   VITAMIN D 25 Hydroxy (Vit-D Deficiency, Fractures) (Completed)    Vitamin B12 (Completed)          Follow up plan: Return in about 1 year (around 05/16/2023) for CPE.  NEXT PREVENTATIVE PHYSICAL DUE IN 1 YEAR.  PATIENT COUNSELING PROVIDED FOR ALL ADULT PATIENTS:  Consume a well balanced diet low in saturated fats, cholesterol, and moderation in carbohydrates.   This can be as simple as monitoring portion sizes and cutting back on sugary beverages such as soda and juice to start with.    Daily water consumption of at least 64 ounces.  Physical activity at least 180 minutes per week, if just starting out.   This can be as simple as taking the stairs instead of the elevator and walking 2-3 laps around the office  purposefully every day.   STD protection, partner selection, and regular testing if high risk.  Limited consumption of alcoholic beverages if alcohol is consumed.  For women, I recommend no more than 7 alcoholic beverages per week, spread out throughout the week.  Avoid "binge" drinking or consuming large quantities of alcohol in one setting.   Please let me know if you feel you may need help with reduction or quitting alcohol consumption.   Avoidance of nicotine, if used.  Please let me know if you feel you may need help with reduction or quitting nicotine use.   Daily mental health attention.  This can be in the form of 5 minute daily meditation, prayer, journaling, yoga, reflection, etc.   Purposeful attention to your emotions and mental state can significantly improve your overall wellbeing and Health.  Please know that I am here to help you with all of your health care goals and am happy to work with you to find a solution that works best for you.  The greatest advice I have received with any changes in life are to take it one step at a time, that even means if all you can focus on is the next 60 seconds, then do that and celebrate your victories.  With any changes in life, you will have set backs, and that is OK. The important thing  to remember is, if you have a set back, it is not a failure, it is an opportunity to try again!  Health Maintenance Recommendations Screening Testing Mammogram Every 1 -2 years based on history and risk factors Starting at age 84 Pap Smear Ages 21-39 every 3 years Ages 10-65 every 5 years with HPV testing More frequent testing may be required based on results and history Colon Cancer Screening Every 1-10 years based on test performed, risk factors, and history Starting at age 87 Bone Density Screening Every 2-10 years based on history Starting at age 59 for women Recommendations for men differ based on medication usage, history, and risk factors AAA Screening One time ultrasound Men 15-82 years old who have every smoked Lung Cancer Screening Low Dose Lung CT every 12 months Age 25-80 years with a 30 pack-year smoking history who still smoke or who have quit within the last 15  years  Screening Labs Routine  Labs: Complete Blood Count (CBC), Complete Metabolic Panel (CMP), Cholesterol (Lipid Panel) Every 6-12 months based on history and medications May be recommended more frequently based on current conditions or previous results Hemoglobin A1c Lab Every 3-12 months based on history and previous results Starting at age 35 or earlier with diagnosis of diabetes, high cholesterol, BMI >26, and/or risk factors Frequent monitoring for patients with diabetes to ensure blood sugar control Thyroid Panel (TSH w/ T3 & T4) Every 6 months based on history, symptoms, and risk factors May be repeated more often if on medication HIV One time testing for all patients 62 and older May be repeated more frequently for patients with increased risk factors or exposure Hepatitis C One time testing for all patients 73 and older May be repeated more frequently for patients with increased risk factors or exposure Gonorrhea, Chlamydia Every 12 months for all sexually active persons 13-24  years Additional monitoring may be recommended for those who are considered high risk or who have symptoms PSA Men 71-52 years old with risk factors Additional screening may be recommended from age 591-69 based on risk factors, symptoms, and history  Vaccine Recommendations Tetanus Booster All adults every 10 years Flu Vaccine All patients 6 months and older every year COVID Vaccine All patients 12 years and older Initial dosing with booster May recommend additional booster based on age and health history HPV Vaccine 2 doses all patients age 59-26 Dosing may be considered for patients over 26 Shingles Vaccine (Shingrix) 2 doses all adults 6 years and older Pneumonia (Pneumovax 23) All adults 44 years and older May recommend earlier dosing based on health history Pneumonia (Prevnar 51) All adults 65 years and older Dosed 1 year after Pneumovax 23  Additional Screening, Testing, and Vaccinations may be recommended on an individualized basis based on family history, health history, risk factors, and/or exposure.

## 2022-05-16 NOTE — Assessment & Plan Note (Signed)
Lallie has successfully lost 20 pounds since December 12th, leading to a five-point decrease in BMI, attributed to the use of phentermine and improved mood and decreased stress eating, possibly due to Wellbutrin. Plan: - Maintain the current regimen of Wellbutrin for mood and appetite control. - Evaluate the option of resuming phentermine or initiating Wegovy for ongoing weight management in the subsequent visit, considering Charlye's preference and clinical guidelines.

## 2022-05-16 NOTE — Patient Instructions (Addendum)
It was a pleasure to see you today! Thank you for allowing me to be a part of your care.   I am SO PROUD OF YOU!!!  Labs: You will see lab results immediately in [place], it may take longer for these results to get to me. Please avoid sending questions about the labs immediately as I often am not able to make complete judgement until all results are in.  I will review the labs and send recommendations through [place] or one of the CMA's will call you to discuss. I understand it can be concerning when there are abnormal findings or results are not well understood. Rest assured, if there are any critical or significantly concerning findings, I will be in touch with you as soon as they are received. Otherwise, recommendations will be sent routinely.  Medication: Please contact your pharmacy to send a request through [place] for routine refills.  If your medication is not listed, or I was not the last prescriber of the medication, please call the office to request the refill.    For all adult patients, I recommend A well balanced diet low in saturated fats, cholesterol, and moderation in carbohydrates.   This can be as simple as monitoring portion sizes and cutting back on sugary beverages such as soda and juice to start with.    Daily water consumption of at least 64 ounces.  Physical activity at least 180 minutes per week, if just starting out.   This can be as simple as taking the stairs instead of the elevator and walking 2-3 laps around the office  purposefully every day.   STD protection, partner selection, and regular testing if high risk.  Limited consumption of alcoholic beverages if alcohol is consumed.  For women, I recommend no more than 7 alcoholic beverages per week, spread out throughout the week.  Avoid "binge" drinking or consuming large quantities of alcohol in one setting.   Please let me know if you feel you may need help with reduction or quitting alcohol consumption.    Avoidance of nicotine, if used.  Please let me know if you feel you may need help with reduction or quitting nicotine use.   Daily mental health attention.  This can be in the form of 5 minute daily meditation, prayer, journaling, yoga, reflection, etc.   Purposeful attention to your emotions and mental state can significantly improve your overall wellbeing  and  Health.  Please know that I am here to help you with all of your health care goals and am happy to work with you to find a solution that works best for you.  The greatest advice I have received with any changes in life are to take it one step at a time, that even means if all you can focus on is the next 60 seconds, then do that and celebrate your victories.  With any changes in life, you will have set backs, and that is OK. The important thing to remember is, if you have a set back, it is not a failure, it is an opportunity to try again!  Health Maintenance Recommendations Screening Testing Mammogram Every 1 -2 years based on history and risk factors Starting at age 3 Pap Smear Ages 21-39 every 3 years Ages 37-65 every 5 years with HPV testing More frequent testing may be required based on results and history Colon Cancer Screening Every 1-10 years based on test performed, risk factors, and history Starting at age 67 Bone Density Screening  Every 2-10 years based on history Starting at age 65 for women Recommendations for men differ based on medication usage, history, and risk factors AAA Screening One time ultrasound Men 68-37 years old who have every smoked Lung Cancer Screening Low Dose Lung CT every 12 months Age 11-80 years with a 30 pack-year smoking history who still smoke or who have quit within the last 15 years  Screening Labs Routine  Labs: Complete Blood Count (CBC), Complete Metabolic Panel (CMP), Cholesterol (Lipid Panel) Every 6-12 months based on history and medications May be recommended more  frequently based on current conditions or previous results Hemoglobin A1c Lab Every 3-12 months based on history and previous results Starting at age 66 or earlier with diagnosis of diabetes, high cholesterol, BMI >26, and/or risk factors Frequent monitoring for patients with diabetes to ensure blood sugar control Thyroid Panel (TSH w/ T3 & T4) Every 6 months based on history, symptoms, and risk factors May be repeated more often if on medication HIV One time testing for all patients 23 and older May be repeated more frequently for patients with increased risk factors or exposure Hepatitis C One time testing for all patients 63 and older May be repeated more frequently for patients with increased risk factors or exposure Gonorrhea, Chlamydia Every 12 months for all sexually active persons 13-24 years Additional monitoring may be recommended for those who are considered high risk or who have symptoms PSA Men 28-26 years old with risk factors Additional screening may be recommended from age 59-69 based on risk factors, symptoms, and history  Vaccine Recommendations Tetanus Booster All adults every 10 years Flu Vaccine All patients 6 months and older every year COVID Vaccine All patients 12 years and older Initial dosing with booster May recommend additional booster based on age and health history HPV Vaccine 2 doses all patients age 65-26 Dosing may be considered for patients over 26 Shingles Vaccine (Shingrix) 2 doses all adults 59 years and older Pneumonia (Pneumovax 23) All adults 68 years and older May recommend earlier dosing based on health history Pneumonia (Prevnar 46) All adults 67 years and older Dosed 1 year after Pneumovax 23  Additional Screening, Testing, and Vaccinations may be recommended on an individualized basis based on family history, health history, risk factors, and/or exposure.

## 2022-05-17 LAB — CBC WITH DIFFERENTIAL/PLATELET
Basophils Absolute: 0.1 10*3/uL (ref 0.0–0.2)
Basos: 2 %
EOS (ABSOLUTE): 0.3 10*3/uL (ref 0.0–0.4)
Eos: 4 %
Hematocrit: 43.5 % (ref 34.0–46.6)
Hemoglobin: 14.7 g/dL (ref 11.1–15.9)
Immature Grans (Abs): 0 10*3/uL (ref 0.0–0.1)
Immature Granulocytes: 0 %
Lymphocytes Absolute: 2.1 10*3/uL (ref 0.7–3.1)
Lymphs: 30 %
MCH: 30.9 pg (ref 26.6–33.0)
MCHC: 33.8 g/dL (ref 31.5–35.7)
MCV: 92 fL (ref 79–97)
Monocytes Absolute: 0.6 10*3/uL (ref 0.1–0.9)
Monocytes: 9 %
Neutrophils Absolute: 3.9 10*3/uL (ref 1.4–7.0)
Neutrophils: 55 %
Platelets: 336 10*3/uL (ref 150–450)
RBC: 4.75 x10E6/uL (ref 3.77–5.28)
RDW: 13.2 % (ref 11.7–15.4)
WBC: 7 10*3/uL (ref 3.4–10.8)

## 2022-05-17 LAB — COMPREHENSIVE METABOLIC PANEL
ALT: 17 IU/L (ref 0–32)
AST: 17 IU/L (ref 0–40)
Albumin/Globulin Ratio: 2 (ref 1.2–2.2)
Albumin: 4.9 g/dL (ref 3.9–4.9)
Alkaline Phosphatase: 93 IU/L (ref 44–121)
BUN/Creatinine Ratio: 9 (ref 9–23)
BUN: 8 mg/dL (ref 6–24)
Bilirubin Total: 0.6 mg/dL (ref 0.0–1.2)
CO2: 22 mmol/L (ref 20–29)
Calcium: 10.1 mg/dL (ref 8.7–10.2)
Chloride: 101 mmol/L (ref 96–106)
Creatinine, Ser: 0.93 mg/dL (ref 0.57–1.00)
Globulin, Total: 2.5 g/dL (ref 1.5–4.5)
Glucose: 81 mg/dL (ref 70–99)
Potassium: 4.3 mmol/L (ref 3.5–5.2)
Sodium: 139 mmol/L (ref 134–144)
Total Protein: 7.4 g/dL (ref 6.0–8.5)
eGFR: 78 mL/min/{1.73_m2} (ref >59)

## 2022-05-17 LAB — TSH RFX ON ABNORMAL TO FREE T4: TSH: 2.91 u[IU]/mL (ref 0.450–4.500)

## 2022-05-17 LAB — LIPID PANEL
Chol/HDL Ratio: 3.7 ratio (ref 0.0–4.4)
Cholesterol, Total: 193 mg/dL (ref 100–199)
HDL: 52 mg/dL (ref >39)
LDL Chol Calc (NIH): 123 mg/dL — ABNORMAL HIGH (ref 0–99)
Triglycerides: 97 mg/dL (ref 0–149)
VLDL Cholesterol Cal: 18 mg/dL (ref 5–40)

## 2022-05-17 LAB — HEMOGLOBIN A1C
Est. average glucose Bld gHb Est-mCnc: 103 mg/dL
Hgb A1c MFr Bld: 5.2 % (ref 4.8–5.6)

## 2022-05-17 LAB — VITAMIN D 25 HYDROXY (VIT D DEFICIENCY, FRACTURES): Vit D, 25-Hydroxy: 39.4 ng/mL (ref 30.0–100.0)

## 2022-05-17 LAB — VITAMIN B12: Vitamin B-12: 379 pg/mL (ref 232–1245)

## 2022-05-19 DIAGNOSIS — H579 Unspecified disorder of eye and adnexa: Secondary | ICD-10-CM | POA: Insufficient documentation

## 2022-05-19 NOTE — Assessment & Plan Note (Signed)
Kelsey [name] has experienced a twitch in the eye, leading to the discovery of a large freckle (nevi) behind the left eye and blood behind the right eye, with a recommendation for follow-up. No symptoms present at this time.  Plan: - Schedule a follow-up appointment with the retina specialist as advised. - Monitor for new symptoms like floaters, sudden vision loss, or a "curtain coming down" over the field of vision, and report immediately to the ophthalmologist. - Review and document specialist reports in [facility]'s medical record for comprehensive care.

## 2022-05-19 NOTE — Assessment & Plan Note (Signed)

## 2022-05-19 NOTE — Assessment & Plan Note (Signed)
Will monitor labs today. No alarm symptoms present at this time.

## 2022-05-19 NOTE — Assessment & Plan Note (Signed)
Kelsey [name] is doing very well with her mood on current regimen of wellbutrin and trazodone for sleep. She has had stress reduction at work and is not experiencing any concerning symptoms at this time. Wellbutrin has also been beneficial for appetite control and snacking behaviors. Plan: - continue current medications and doses.  - notify if new or worsening symptoms present.

## 2022-05-30 DIAGNOSIS — H35041 Retinal micro-aneurysms, unspecified, right eye: Secondary | ICD-10-CM | POA: Diagnosis not present

## 2022-05-30 DIAGNOSIS — D3132 Benign neoplasm of left choroid: Secondary | ICD-10-CM | POA: Diagnosis not present

## 2022-05-30 DIAGNOSIS — H43823 Vitreomacular adhesion, bilateral: Secondary | ICD-10-CM | POA: Diagnosis not present

## 2022-06-03 ENCOUNTER — Other Ambulatory Visit (HOSPITAL_COMMUNITY): Payer: Self-pay

## 2022-07-03 ENCOUNTER — Other Ambulatory Visit (HOSPITAL_COMMUNITY): Payer: Self-pay

## 2022-07-11 ENCOUNTER — Other Ambulatory Visit: Payer: Self-pay

## 2022-07-11 ENCOUNTER — Other Ambulatory Visit (HOSPITAL_COMMUNITY): Payer: Self-pay

## 2022-07-22 DIAGNOSIS — L700 Acne vulgaris: Secondary | ICD-10-CM | POA: Diagnosis not present

## 2022-07-22 DIAGNOSIS — D225 Melanocytic nevi of trunk: Secondary | ICD-10-CM | POA: Diagnosis not present

## 2022-07-22 DIAGNOSIS — D2262 Melanocytic nevi of left upper limb, including shoulder: Secondary | ICD-10-CM | POA: Diagnosis not present

## 2022-07-22 DIAGNOSIS — D2271 Melanocytic nevi of right lower limb, including hip: Secondary | ICD-10-CM | POA: Diagnosis not present

## 2022-07-22 DIAGNOSIS — D2372 Other benign neoplasm of skin of left lower limb, including hip: Secondary | ICD-10-CM | POA: Diagnosis not present

## 2022-07-22 DIAGNOSIS — L918 Other hypertrophic disorders of the skin: Secondary | ICD-10-CM | POA: Diagnosis not present

## 2022-07-22 DIAGNOSIS — D2272 Melanocytic nevi of left lower limb, including hip: Secondary | ICD-10-CM | POA: Diagnosis not present

## 2022-07-22 DIAGNOSIS — D1801 Hemangioma of skin and subcutaneous tissue: Secondary | ICD-10-CM | POA: Diagnosis not present

## 2022-07-22 DIAGNOSIS — D2261 Melanocytic nevi of right upper limb, including shoulder: Secondary | ICD-10-CM | POA: Diagnosis not present

## 2022-07-22 DIAGNOSIS — L821 Other seborrheic keratosis: Secondary | ICD-10-CM | POA: Diagnosis not present

## 2022-09-04 DIAGNOSIS — D3132 Benign neoplasm of left choroid: Secondary | ICD-10-CM | POA: Diagnosis not present

## 2022-09-04 DIAGNOSIS — H43823 Vitreomacular adhesion, bilateral: Secondary | ICD-10-CM | POA: Diagnosis not present

## 2022-09-04 DIAGNOSIS — H35041 Retinal micro-aneurysms, unspecified, right eye: Secondary | ICD-10-CM | POA: Diagnosis not present

## 2022-09-29 ENCOUNTER — Other Ambulatory Visit: Payer: Self-pay

## 2022-09-29 ENCOUNTER — Other Ambulatory Visit (HOSPITAL_COMMUNITY): Payer: Self-pay

## 2022-09-29 ENCOUNTER — Other Ambulatory Visit: Payer: Self-pay | Admitting: Nurse Practitioner

## 2022-09-29 DIAGNOSIS — Z6834 Body mass index (BMI) 34.0-34.9, adult: Secondary | ICD-10-CM

## 2022-09-29 MED ORDER — PHENTERMINE HCL 37.5 MG PO TABS
37.5000 mg | ORAL_TABLET | Freq: Every day | ORAL | 0 refills | Status: DC
  Filled 2022-09-29: qty 30, 30d supply, fill #0

## 2022-09-29 NOTE — Telephone Encounter (Signed)
Refill request last apt 05/16/22.

## 2023-01-09 ENCOUNTER — Telehealth: Payer: Commercial Managed Care - PPO | Admitting: Medical

## 2023-01-09 VITALS — Temp 102.0°F | Wt 150.0 lb

## 2023-01-09 DIAGNOSIS — R051 Acute cough: Secondary | ICD-10-CM | POA: Diagnosis not present

## 2023-01-09 DIAGNOSIS — J988 Other specified respiratory disorders: Secondary | ICD-10-CM

## 2023-01-09 MED ORDER — AZITHROMYCIN 250 MG PO TABS: ORAL_TABLET | ORAL | 0 refills | Status: DC

## 2023-01-09 MED ORDER — BENZONATATE 200 MG PO CAPS: 200.0000 mg | ORAL_CAPSULE | Freq: Three times a day (TID) | ORAL | 0 refills | Status: DC | PRN

## 2023-01-09 MED ORDER — HYDROCODONE BIT-HOMATROP MBR 5-1.5 MG/5ML PO SOLN: 5.0000 mL | Freq: Three times a day (TID) | ORAL | 0 refills | Status: AC | PRN

## 2023-01-09 NOTE — Progress Notes (Signed)
Subjective:     Patient ID: Kelsey [name], female   DOB: 05/19/1980, 42 y.o.   MRN: [id number]  This visit type was conducted due to national recommendations for restrictions regarding the COVID-19 Pandemic (e.g. social distancing) in an effort to limit this patient's exposure and mitigate transmission in our community.  Due to their co-morbid illnesses, this patient is at least at moderate risk for complications without adequate follow up.  This format is felt to be most appropriate for this patient at this time.    Documentation for virtual audio and video telecommunications through [place] encounter:  The patient was located at home. The provider was located in the office. The patient did consent to this visit and is aware of possible charges through their insurance for this visit.  The other persons participating in this telemedicine service were none. Time spent on call was 20 minutes and in review of previous records 20 minutes total.  This virtual service is not related to other E/M service within previous 7 days.   HPI Chief Complaint  Patient presents with   sick    Sick, fever, deep cough and congestion and headache. Symptoms started Monday. Covid test negative every day. Son has plasma Pneumonia for the last 3 weeks.    Virtual for illness.  Been sick since Monday 5 days ago.   She notes fever, chills, cough, congestion, headaches.  No runny nose, no sneezing.   No sob, but hurts to cough.  No productive cough.   Had several covid tests this week that were all negative.  Using advil and tylneol alternating.   Not sleeping well.  Used some mucinex during the day.    No hx/o asthma or lung disease.  Nonsmoker .  No heavy alcohol use.    Her 44yo was hospitalized 3 weeks ago with mycoplasma pneumonia.  He is finally improving after a hard fought illness.  No other sick contacts  No other aggravating or relieving factors. No other complaint.   Past Medical History:   Diagnosis Date   Closed fracture of left distal radius 09/18/2021   Closed patellar dislocation, right, sequela 01/28/2016   Injury was on 01/23/2016 when she just turned while standing at a computer.   Headache(784.0)    Left knee pain 02/20/2016   She has mildly abnormal tracking and genu valgus.   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 90 tablet 3   phentermine (ADIPEX-P) 37.5 MG tablet Take 1 tablet (37.5 mg total) by mouth daily before breakfast. 30 tablet 0   traZODone (DESYREL) 50 MG tablet Take 1/2-1 tablet (25-50 mg total) by mouth at bedtime as needed for sleep. 90 tablet 3   phentermine (ADIPEX-P) 37.5 MG tablet Take 1 tablet (37.5 mg total) by mouth daily before breakfast. 30 tablet 0   No current facility-administered medications on file prior to visit.    Review of Systems As in subjective    Objective:   Physical Exam  Due to coronavirus pandemic stay at home measures, patient visit was virtual and they were not examined in person.   Temp (!) 102 F (38.9 C)   Wt 150 lb (68 kg)   BMI 27.00 kg/m   Gen: wd, wn ,nad Coughing a bit No labored breathing or wheezing     Assessment:     Encounter Diagnoses  Name Primary?   Acute cough Yes   Respiratory tract infection        Plan:     We discussed symptoms, concerns, and her son's illness.  Glad to hear her son is doing better from mycoplasma pneumonia.  Advise rest, hydration, begin medications below,  Tessalon during the day, Hycodan at night.  Begin Z-Pak antibiotic.  Can use either allergy medication such as Benadryl or Mucinex over-the-counter to help with congestion.  If not improving or if worse such as shortness of breath in the next few days get reevaluated.   Maeli was seen today for sick.  Diagnoses and all orders for this visit:  Acute cough  Respiratory tract infection  Other orders -     azithromycin (ZITHROMAX) 250 MG tablet; 2 tablets day 1, then 1 tablet days 2-4 -     HYDROcodone bit-homatropine (HYCODAN) 5-1.5 MG/5ML syrup; Take 5 mLs by mouth every 8 (eight) hours as needed for up to 5 days for cough. -     benzonatate (TESSALON) 200 MG capsule; Take 1 capsule (200 mg total) by mouth 3 (three) times daily as needed for cough.  F/u prn

## 2023-02-23 ENCOUNTER — Ambulatory Visit (INDEPENDENT_AMBULATORY_CARE_PROVIDER_SITE_OTHER): Payer: Self-pay | Admitting: Plastic Surgery

## 2023-02-23 ENCOUNTER — Encounter: Payer: Self-pay | Admitting: Plastic Surgery

## 2023-02-23 DIAGNOSIS — Z719 Counseling, unspecified: Secondary | ICD-10-CM | POA: Insufficient documentation

## 2023-02-23 NOTE — Progress Notes (Signed)

## 2023-04-08 DIAGNOSIS — D3132 Benign neoplasm of left choroid: Secondary | ICD-10-CM | POA: Diagnosis not present

## 2023-04-08 DIAGNOSIS — H43823 Vitreomacular adhesion, bilateral: Secondary | ICD-10-CM | POA: Diagnosis not present

## 2023-04-08 DIAGNOSIS — H35041 Retinal micro-aneurysms, unspecified, right eye: Secondary | ICD-10-CM | POA: Diagnosis not present

## 2023-05-13 ENCOUNTER — Other Ambulatory Visit: Payer: Self-pay | Admitting: Nurse Practitioner

## 2023-05-13 DIAGNOSIS — Z0000 Encounter for general adult medical examination without abnormal findings: Secondary | ICD-10-CM

## 2023-05-14 ENCOUNTER — Ambulatory Visit
Admission: RE | Admit: 2023-05-14 | Discharge: 2023-05-14 | Disposition: A | Discharge status: Home / Self Care | Source: Ambulatory Visit | Attending: Nurse Practitioner | Admitting: Nurse Practitioner

## 2023-05-14 DIAGNOSIS — Z1231 Encounter for screening mammogram for malignant neoplasm of breast: Secondary | ICD-10-CM | POA: Diagnosis not present

## 2023-05-14 DIAGNOSIS — Z0000 Encounter for general adult medical examination without abnormal findings: Secondary | ICD-10-CM

## 2023-05-20 ENCOUNTER — Encounter: Payer: Self-pay | Admitting: Nurse Practitioner

## 2023-05-21 ENCOUNTER — Encounter: Payer: Self-pay | Admitting: Nurse Practitioner

## 2023-05-21 ENCOUNTER — Ambulatory Visit (INDEPENDENT_AMBULATORY_CARE_PROVIDER_SITE_OTHER): Payer: Commercial Managed Care - PPO | Admitting: Nurse Practitioner

## 2023-05-21 ENCOUNTER — Other Ambulatory Visit (HOSPITAL_COMMUNITY): Payer: Self-pay

## 2023-05-21 VITALS — BP 120/78 | HR 64 | Ht 62.0 in | Wt 180.0 lb

## 2023-05-21 DIAGNOSIS — E559 Vitamin D deficiency, unspecified: Secondary | ICD-10-CM | POA: Diagnosis not present

## 2023-05-21 DIAGNOSIS — Z6834 Body mass index (BMI) 34.0-34.9, adult: Secondary | ICD-10-CM | POA: Diagnosis not present

## 2023-05-21 DIAGNOSIS — Z23 Encounter for immunization: Secondary | ICD-10-CM

## 2023-05-21 DIAGNOSIS — E538 Deficiency of other specified B group vitamins: Secondary | ICD-10-CM | POA: Diagnosis not present

## 2023-05-21 DIAGNOSIS — Z0000 Encounter for general adult medical examination without abnormal findings: Secondary | ICD-10-CM | POA: Diagnosis not present

## 2023-05-21 DIAGNOSIS — F32A Depression, unspecified: Secondary | ICD-10-CM | POA: Diagnosis not present

## 2023-05-21 MED ORDER — BUPROPION HCL ER (XL) 150 MG PO TB24
150.0000 mg | ORAL_TABLET | Freq: Every day | ORAL | 3 refills | Status: AC
  Filled 2023-05-21: qty 90, 90d supply, fill #0

## 2023-05-21 MED ORDER — PHENTERMINE HCL 37.5 MG PO TABS
37.5000 mg | ORAL_TABLET | Freq: Every day | ORAL | 0 refills | Status: DC
  Filled 2023-05-21: qty 90, 90d supply, fill #0

## 2023-05-21 NOTE — Assessment & Plan Note (Signed)
 Stable on wellbutrin with no concerning symptoms at this time.  -Continue wellbutrin. I have sent refills for the year.

## 2023-05-21 NOTE — Progress Notes (Signed)
 Shawna Clamp, DNP, AGNP-c [facility] [address] [place], [place] [address] Main Office [phone]  BP 120/78   Pulse 64   Ht 5\' 2"  (1.575 m)   Wt 180 lb (81.6 kg)   LMP 05/07/2023   BMI 32.92 kg/m    Subjective:    Patient ID: Kelsey [name], female    DOB: [birth year], 43 y.o.   MRN: [id number]  HPI: Kelsey [name] is a 43 y.o. female presenting on 05/21/2023 for comprehensive medical examination.   History of Present Illness The patient is a 43 year old who presents for an annual physical exam.  She reports a sensation of fullness in the left ear, which has persisted since an upper respiratory infection a few weeks ago. There is no pain, but she recalls having frequent ear infections as a child and wanted to make sure it was ok. The fullness is occasional and not currently causing significant discomfort.  She acknowledges weight gain and wants to lose weight and exercise more. She has a new puppy, which she plans to use as motivation to walk more. She has previously used phentermine with good results and is considering restarting it to help with weight management. She has stopped taking trazodone as she was falling asleep without it and is currently sleeping well.  She is currently on Wellbutrin and reports that her mood is stable.   Pertinent items are noted in HPI.  Due for Tdap today   Most Recent Depression Screen:     05/21/2023    8:15 AM 05/16/2022    8:09 AM 05/20/2021    7:59 PM 04/27/2018    8:59 AM 08/24/2017    9:59 AM  Depression screen PHQ 2/9  Decreased Interest 0 0 0 0 0  Down, Depressed, Hopeless 0 0 0 0 0  PHQ - 2 Score 0 0 0 0 0  Altered sleeping    0   Tired, decreased energy    0   Change in appetite    0   Feeling bad or failure about yourself     0   Trouble concentrating    0   Moving slowly or fidgety/restless    0   Suicidal thoughts    0   PHQ-9 Score    0    Most Recent Anxiety Screen:      No data to display          Most Recent Fall Screen:    05/21/2023    8:15 AM 05/16/2022    8:09 AM 05/20/2021    7:59 PM 08/24/2017    9:59 AM 03/11/2016    9:27 AM  Fall Risk   Falls in the past year? 0 0 0 No No  Number falls in past yr: 0 0 0    Injury with Fall? 0 0 0    Risk for fall due to : No Fall Risks No Fall Risks No Fall Risks    Follow up Falls evaluation completed Falls evaluation completed Falls evaluation completed      Past medical history, surgical history, medications, allergies, family history and social history reviewed with patient today and changes made to appropriate areas of the chart.  Past Medical History:  Past Medical History:  Diagnosis Date   Closed fracture of left distal radius 09/18/2021   Closed patellar dislocation, right, sequela 01/28/2016   Injury was on 01/23/2016 when she just turned while standing at a computer.   Headache(784.0)  Left knee pain 02/20/2016   She has mildly abnormal tracking and genu valgus.   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   No current facility-administered medications on file prior to visit.   Surgical History:  Past Surgical History:  Procedure Laterality Date   CESAREAN SECTION  2008, 2013   CHOLECYSTECTOMY  12/2013   DILATION AND EVACUATION  10/04/2010   Procedure: DILATATION AND EVACUATION (D&E);  Surgeon: Zelphia Cairo;  Location: [facility];  Service: Gynecology;  Laterality: N/A;  pt last ate at 8:30am   WISDOM TOOTH EXTRACTION  2006   Allergies:  Allergies  Allergen Reactions   Sulfa Antibiotics Rash   Sulfonamide  Derivatives Rash   Family History:  Family History  Problem Relation Age of Onset   Cancer Mother    Hypertension Father    Alcohol abuse Father    Heart disease Father        Objective:    BP 120/78   Pulse 64   Ht 5\' 2"  (1.575 m)   Wt 180 lb (81.6 kg)   LMP 05/07/2023   BMI 32.92 kg/m   Wt Readings from Last 3 Encounters:  05/21/23 180 lb (81.6 kg)  01/09/23 150 lb (68 kg)  05/16/22 164 lb (74.4 kg)    Physical Exam Vitals and nursing note reviewed.  Constitutional:      General: She is not in acute distress.    Appearance: Normal appearance. She is not ill-appearing.  HENT:     Head: Normocephalic and atraumatic.     Right Ear: Hearing, ear canal and external ear normal. A middle ear effusion is present.     Left Ear: Hearing, ear canal and external ear normal. A middle ear effusion is present.     Nose: Nose normal.     Right Sinus: No maxillary sinus tenderness or frontal sinus tenderness.     Left Sinus: No maxillary sinus tenderness or frontal sinus tenderness.     Mouth/Throat:     Lips: Pink.     Mouth: Mucous membranes are moist.     Pharynx: Oropharynx is clear.  Eyes:     General: Lids are normal. Vision grossly intact.     Extraocular Movements: Extraocular movements intact.     Conjunctiva/sclera: Conjunctivae normal.     Pupils: Pupils are equal, round, and reactive to light.     Funduscopic exam:    Right eye: Red reflex present.        Left eye: Red reflex present.    Visual Fields: Right eye visual fields normal and left eye visual fields normal.  Neck:     Thyroid: No thyromegaly.     Vascular: No carotid bruit.  Cardiovascular:     Rate and Rhythm: Normal rate and regular rhythm.     Chest Wall: PMI is not displaced.     Pulses: Normal pulses.          Dorsalis pedis pulses are 2+ on the right side and 2+ on the left side.       Posterior tibial pulses are 2+ on  the right side and 2+ on the left side.     Heart sounds: Normal heart  sounds. No murmur heard. Pulmonary:     Effort: Pulmonary effort is normal. No respiratory distress.     Breath sounds: Normal breath sounds.  Abdominal:     General: Abdomen is flat. Bowel sounds are normal. There is no distension.     Palpations: Abdomen is soft. There is no hepatomegaly, splenomegaly or mass.     Tenderness: There is no abdominal tenderness. There is no right CVA tenderness, left CVA tenderness, guarding or rebound.  Musculoskeletal:        General: Normal range of motion.     Cervical back: Full passive range of motion without pain, normal range of motion and neck supple. No tenderness.     Right lower leg: No edema.     Left lower leg: No edema.  Feet:     Left foot:     Toenail Condition: Left toenails are normal.  Lymphadenopathy:     Cervical: No cervical adenopathy.     Upper Body:     Right upper body: No supraclavicular adenopathy.     Left upper body: No supraclavicular adenopathy.  Skin:    General: Skin is warm and dry.     Capillary Refill: Capillary refill takes less than 2 seconds.     Nails: There is no clubbing.  Neurological:     General: No focal deficit present.     Mental Status: She is alert and oriented to person, place, and time.     GCS: GCS eye subscore is 4. GCS verbal subscore is 5. GCS motor subscore is 6.     Sensory: Sensation is intact.     Motor: Motor function is intact.     Coordination: Coordination is intact.     Gait: Gait is intact.     Deep Tendon Reflexes: Reflexes are normal and symmetric.  Psychiatric:        Attention and Perception: Attention normal.        Mood and Affect: Mood normal.        Speech: Speech normal.        Behavior: Behavior normal. Behavior is cooperative.        Thought Content: Thought content normal.        Cognition and Memory: Cognition and memory normal.        Judgment: Judgment normal.      Results for orders placed or performed in visit on 05/16/22  CBC with Differential/Platelet    Collection Time: 05/16/22  9:13 AM  Result Value Ref Range   WBC 7.0 3.4 - 10.8 x10E3/uL   RBC 4.75 3.77 - 5.28 x10E6/uL   Hemoglobin 14.7 11.1 - 15.9 g/dL   Hematocrit 78.2 95.6 - 46.6 %   MCV 92 79 - 97 fL   MCH 30.9 26.6 - 33.0 pg   MCHC 33.8 31.5 - 35.7 g/dL   RDW 21.3 08.6 - 57.8 %   Platelets 336 150 - 450 x10E3/uL   Neutrophils 55 Not Estab. %   Lymphs 30 Not Estab. %   Monocytes 9 Not Estab. %   Eos 4 Not Estab. %   Basos 2 Not Estab. %   Neutrophils Absolute 3.9 1.4 - 7.0 x10E3/uL   Lymphocytes Absolute 2.1 0.7 - 3.1 x10E3/uL   Monocytes Absolute 0.6 0.1 - 0.9 x10E3/uL   EOS (ABSOLUTE) 0.3 0.0 - 0.4 x10E3/uL   Basophils Absolute 0.1 0.0 -  0.2 x10E3/uL   Immature Granulocytes 0 Not Estab. %   Immature Grans (Abs) 0.0 0.0 - 0.1 x10E3/uL  Comprehensive metabolic panel   Collection Time: 05/16/22  9:13 AM  Result Value Ref Range   Glucose 81 70 - 99 mg/dL   BUN 8 6 - 24 mg/dL   Creatinine, Ser 4.09 0.57 - 1.00 mg/dL   eGFR 78 >81 XB/JYN/8.29   BUN/Creatinine Ratio 9 9 - 23   Sodium 139 134 - 144 mmol/L   Potassium 4.3 3.5 - 5.2 mmol/L   Chloride 101 96 - 106 mmol/L   CO2 22 20 - 29 mmol/L   Calcium 10.1 8.7 - 10.2 mg/dL   Total Protein 7.4 6.0 - 8.5 g/dL   Albumin 4.9 3.9 - 4.9 g/dL   Globulin, Total 2.5 1.5 - 4.5 g/dL   Albumin/Globulin Ratio 2.0 1.2 - 2.2   Bilirubin Total 0.6 0.0 - 1.2 mg/dL   Alkaline Phosphatase 93 44 - 121 IU/L   AST 17 0 - 40 IU/L   ALT 17 0 - 32 IU/L  Lipid panel   Collection Time: 05/16/22  9:13 AM  Result Value Ref Range   Cholesterol, Total 193 100 - 199 mg/dL   Triglycerides 97 0 - 149 mg/dL   HDL 52 >56 mg/dL   VLDL Cholesterol Cal 18 5 - 40 mg/dL   LDL Chol Calc (NIH) 213 (H) 0 - 99 mg/dL   Chol/HDL Ratio 3.7 0.0 - 4.4 ratio  Hemoglobin A1c   Collection Time: 05/16/22  9:13 AM  Result Value Ref Range   Hgb A1c MFr Bld 5.2 4.8 - 5.6 %   Est. average glucose Bld gHb Est-mCnc 103 mg/dL  TSH Rfx on Abnormal to Free T4    Collection Time: 05/16/22  9:13 AM  Result Value Ref Range   TSH 2.910 0.450 - 4.500 uIU/mL  VITAMIN D 25 Hydroxy (Vit-D Deficiency, Fractures)   Collection Time: 05/16/22  9:13 AM  Result Value Ref Range   Vit D, 25-Hydroxy 39.4 30.0 - 100.0 ng/mL  Vitamin B12   Collection Time: 05/16/22  9:13 AM  Result Value Ref Range   Vitamin B-12 379 232 - 1,245 pg/mL       Assessment & Plan:   Problem List Items Addressed This Visit     Encounter for annual physical exam - Primary   Kelsey [name], a 43 year old female, presents for her annual physical exam. She experiences a sensation of fullness in her left ear without pain. Examination reveals fluid behind both eardrums, more pronounced on the left, without infection. She discontinued trazodone and is well-managed on Wellbutrin for mood stabilization. She aims to lose weight and increase physical activity after winter weight gain. She is considering UVB therapy light for mood and energy enhancement during winter. - Refill Wellbutrin prescription - Prescribe phentermine for weight management - Administer Tdap vaccine - Encourage increased physical activity and walking - Monitor ear fullness; consider Xyzal at bedtime if needed - Consider UVB therapy light for mood and energy during winter - Discuss lifestyle modifications for weight management and increased physical activity      Relevant Orders   CBC with Differential/Platelet   CMP14+EGFR   Hemoglobin A1c   Lipid panel   TSH   BMI 34.0-34.9,adult   Some weight gain over the winter, but BMI still below starting point. Ready to begin diet and exercise again. Interested trying phentermine again to aid initially as this has been helpful in the past. Information  on weight management on AVS and discussed.  - Start phentermine - Work on diet and exercise for overall health and wellbeing.       Relevant Medications   phentermine (ADIPEX-P) 37.5 MG tablet   Other Relevant Orders   CMP14+EGFR    Hemoglobin A1c   Lipid panel   TSH   Depressive episode   Stable on wellbutrin with no concerning symptoms at this time.  -Continue wellbutrin. I have sent refills for the year.       Relevant Medications   buPROPion (WELLBUTRIN XL) 150 MG 24 hr tablet   Other Relevant Orders   TSH   B12 deficiency   Relevant Orders   Vitamin B12   Vitamin D deficiency   Relevant Orders   TSH   VITAMIN D 25 Hydroxy (Vit-D Deficiency, Fractures)   Other Visit Diagnoses       Need for tetanus booster       Relevant Orders   Tdap vaccine greater than or equal to 7yo IM (Completed)         Follow up plan: Return in about 1 year (around 05/20/2024) for CPE.  NEXT PREVENTATIVE PHYSICAL DUE IN 1 YEAR.  PATIENT COUNSELING PROVIDED FOR ALL ADULT PATIENTS: A well balanced diet low in saturated fats, cholesterol, and moderation in carbohydrates.  This can be as simple as monitoring portion sizes and cutting back on sugary beverages such as soda and juice to start with.    Daily water consumption of at least 64 ounces.  Physical activity at least 180 minutes per week.  If just starting out, start 10 minutes a day and work your way up.   This can be as simple as taking the stairs instead of the elevator and walking 2-3 laps around the office  purposefully every day.   STD protection, partner selection, and regular testing if high risk.  Limited consumption of alcoholic beverages if alcohol is consumed. For men, I recommend no more than 14 alcoholic beverages per week, spread out throughout the week (max 2 per day). Avoid "binge" drinking or consuming large quantities of alcohol in one setting.  Please let me know if you feel you may need help with reduction or quitting alcohol consumption.   Avoidance of nicotine, if used. Please let me know if you feel you may need help with reduction or quitting nicotine use.   Daily mental health attention. This can be in the form of 5 minute daily  meditation, prayer, journaling, yoga, reflection, etc.  Purposeful attention to your emotions and mental state can significantly improve your overall wellbeing  and  Health.  Please know that I am here to help you with all of your health care goals and am happy to work with you to find a solution that works best for you.  The greatest advice I have received with any changes in life are to take it one step at a time, that even means if all you can focus on is the next 60 seconds, then do that and celebrate your victories.  With any changes in life, you will have set backs, and that is OK. The important thing to remember is, if you have a set back, it is not a failure, it is an opportunity to try again! Screening Testing Mammogram Every 1 -2 years based on history and risk factors Starting at age 33 Pap Smear Ages 21-39 every 3 years Ages 84-65 every 5 years with HPV testing More frequent testing may  be required based on results and history Colon Cancer Screening Every 1-10 years based on test performed, risk factors, and history Starting at age 76 Bone Density Screening Every 2-10 years based on history Starting at age 68 for women Recommendations for men differ based on medication usage, history, and risk factors AAA Screening One time ultrasound Men 29-30 years old who have every smoked Lung Cancer Screening Low Dose Lung CT every 12 months Age 31-80 years with a 30 pack-year smoking history who still smoke or who have quit within the last 15 years   Screening Labs Routine  Labs: Complete Blood Count (CBC), Complete Metabolic Panel (CMP), Cholesterol (Lipid Panel) Every 6-12 months based on history and medications May be recommended more frequently based on current conditions or previous results Hemoglobin A1c Lab Every 3-12 months based on history and previous results Starting at age 35 or earlier with diagnosis of diabetes, high cholesterol, BMI >26, and/or risk  factors Frequent monitoring for patients with diabetes to ensure blood sugar control Thyroid Panel (TSH) Every 6 months based on history, symptoms, and risk factors May be repeated more often if on medication HIV One time testing for all patients 63 and older May be repeated more frequently for patients with increased risk factors or exposure Hepatitis C One time testing for all patients 87 and older May be repeated more frequently for patients with increased risk factors or exposure Gonorrhea, Chlamydia Every 12 months for all sexually active persons 13-24 years Additional monitoring may be recommended for those who are considered high risk or who have symptoms Every 12 months for any woman on birth control, regardless of sexual activity PSA Men 44-45 years old with risk factors Additional screening may be recommended from age 59-69 based on risk factors, symptoms, and history  Vaccine Recommendations Tetanus Booster All adults every 10 years Flu Vaccine All patients 6 months and older every year COVID Vaccine All patients 12 years and older Initial dosing with booster May recommend additional booster based on age and health history HPV Vaccine 2 doses all patients age 7-26 Dosing may be considered for patients over 26 Shingles Vaccine (Shingrix) 2 doses all adults 55 years and older Pneumonia (Pneumovax 83) All adults 65 years and older May recommend earlier dosing based on health history One year apart from Prevnar 68 Pneumonia (Prevnar 55) All adults 65 years and older Dosed 1 year after Pneumovax 23 Pneumonia (Prevnar 20) One time alternative to the two dosing of 13 and 23 For all adults with initial dose of 23, 20 is recommended 1 year later For all adults with initial dose of 13, 23 is still recommended as second option 1 year later

## 2023-05-21 NOTE — Assessment & Plan Note (Signed)
 Some weight gain over the winter, but BMI still below starting point. Ready to begin diet and exercise again. Interested trying phentermine again to aid initially as this has been helpful in the past. Information on weight management on AVS and discussed.  - Start phentermine - Work on diet and exercise for overall health and wellbeing.

## 2023-05-21 NOTE — Patient Instructions (Addendum)
 I have sent in the phentermine for you to start again to help with weight loss. :-) I have refilled your Wellbutrin, if you have any changes, please let me know.   WEIGHT LOSS PLANNING Your progress today shows:     05/21/2023    8:16 AM 01/09/2023    9:14 AM 05/16/2022    8:09 AM  Vitals with BMI  Height 5\' 2"   5' 2.5"  Weight 180 lbs 150 lbs 164 lbs  BMI 32.91  29.5  Systolic 120  118  Diastolic 78  72  Pulse 64  68    For best management of weight, it is vital to balance intake versus output. This means the number of calories burned per day must be less than the calories you take in with food and drink.   I recommend trying to follow a diet with the following: Calories: 1200-1500 calories per day Carbohydrates: 150-180 grams of carbohydrates per day  Why: Gives your body enough "quick fuel" for cells to maintain normal function without sending them into starvation mode.  Protein: At least 90 grams of protein per day- 30 grams with each meal Why: Protein takes longer and uses more energy than carbohydrates to break down for fuel. The carbohydrates in your meals serves as quick energy sources and proteins help use some of that extra quick energy to break down to produce long term energy. This helps you not feel hungry as quickly and protein breakdown burns calories.  Water: Drink AT LEAST 64 ounces of water per day  Why: Water is essential to healthy metabolism. Water helps to fill the stomach and keep you fuller longer. Water is required for healthy digestion and filtering of waste in the body.  Fat: Limit fats in your diet- when choosing fats, choose foods with lower fats content such as lean meats (chicken, fish, [place]).  Why: Increased fat intake leads to storage "for later". Once you burn your carbohydrate energy, your body goes into fat and protein breakdown mode to help you loose weight.  Cholesterol: Fats and oils that are LIQUID at room temperature are best. Choose vegetable  oils (olive oil, avocado oil, nuts). Avoid fats that are SOLID at room temperature (animal fats, processed meats). Healthy fats are often found in whole grains, beans, nuts, seeds, and berries.  Why: Elevated cholesterol levels lead to build up of cholesterol on the inside of your blood vessels. This will eventually cause the blood vessels to become hard and can lead to high blood pressure and damage to your organs. When the blood flow is reduced, but the pressure is high from cholesterol buildup, parts of the cholesterol can break off and form clots that can go to the brain or heart leading to a stroke or heart attack.  Fiber: Increase amount of SOLUBLE the fiber in your diet. This helps to fill you up, lowers cholesterol, and helps with digestion. Some foods high in soluble fiber are oats, peas, beans, apples, carrots, barley, and citrus fruits.   Why: Fiber fills you up, helps remove excess cholesterol, and aids in healthy digestion which are all very important in weight management.   I recommend the following as a minimum activity routine: Purposeful walk or other physical activity at least 20 minutes every single day. This means purposefully taking a walk, jog, bike, swim, treadmill, elliptical, dance, etc.  This activity should be ABOVE your normal daily activities, such as walking at work. Goal exercise should be at least 150 minutes a  week- work your way up to this.   Heart Rate: Your maximum exercise heart rate should be 220 - Your Age in Years. When exercising, get your heart rate up, but avoid going over the maximum targeted heart rate.  60-70% of your maximum heart rate is where you tend to burn the most fat. To find this number:  220 - Age In Years= Max HR  Max HR x 0.6 (or 0.7) = Fat Burning HR The Fat Burning HR is your goal heart rate while working out to burn the most fat.  NEVER exercise to the point your feel lightheaded, weak, nauseated, dizzy. If you experience ANY of these  symptoms- STOP exercise! Allow yourself to cool down and your heart rate to come down. Then restart slower next time.  If at ANY TIME you feel chest pain or chest pressure during exercise, STOP IMMEDIATELY and seek medical attention.

## 2023-05-21 NOTE — Assessment & Plan Note (Signed)
 Kelsey [name], a 43 year old female, presents for her annual physical exam. She experiences a sensation of fullness in her left ear without pain. Examination reveals fluid behind both eardrums, more pronounced on the left, without infection. She discontinued trazodone and is well-managed on Wellbutrin for mood stabilization. She aims to lose weight and increase physical activity after winter weight gain. She is considering UVB therapy light for mood and energy enhancement during winter. - Refill Wellbutrin prescription - Prescribe phentermine for weight management - Administer Tdap vaccine - Encourage increased physical activity and walking - Monitor ear fullness; consider Xyzal at bedtime if needed - Consider UVB therapy light for mood and energy during winter - Discuss lifestyle modifications for weight management and increased physical activity

## 2023-05-22 LAB — CBC WITH DIFFERENTIAL/PLATELET
Basophils Absolute: 0.1 10*3/uL (ref 0.0–0.2)
Basos: 2 %
EOS (ABSOLUTE): 0.4 10*3/uL (ref 0.0–0.4)
Eos: 5 %
Hematocrit: 42.7 % (ref 34.0–46.6)
Hemoglobin: 14.1 g/dL (ref 11.1–15.9)
Immature Grans (Abs): 0 10*3/uL (ref 0.0–0.1)
Immature Granulocytes: 0 %
Lymphocytes Absolute: 2 10*3/uL (ref 0.7–3.1)
Lymphs: 26 %
MCH: 30.7 pg (ref 26.6–33.0)
MCHC: 33 g/dL (ref 31.5–35.7)
MCV: 93 fL (ref 79–97)
Monocytes Absolute: 0.6 10*3/uL (ref 0.1–0.9)
Monocytes: 8 %
Neutrophils Absolute: 4.5 10*3/uL (ref 1.4–7.0)
Neutrophils: 59 %
Platelets: 340 10*3/uL (ref 150–450)
RBC: 4.59 x10E6/uL (ref 3.77–5.28)
RDW: 13.1 % (ref 11.7–15.4)
WBC: 7.5 10*3/uL (ref 3.4–10.8)

## 2023-05-22 LAB — CMP14+EGFR
ALT: 21 IU/L (ref 0–32)
AST: 18 IU/L (ref 0–40)
Albumin: 4.4 g/dL (ref 3.9–4.9)
Alkaline Phosphatase: 85 IU/L (ref 44–121)
BUN/Creatinine Ratio: 15 (ref 9–23)
BUN: 13 mg/dL (ref 6–24)
Bilirubin Total: 0.5 mg/dL (ref 0.0–1.2)
CO2: 20 mmol/L (ref 20–29)
Calcium: 9.1 mg/dL (ref 8.7–10.2)
Chloride: 102 mmol/L (ref 96–106)
Creatinine, Ser: 0.84 mg/dL (ref 0.57–1.00)
Globulin, Total: 2.6 g/dL (ref 1.5–4.5)
Glucose: 81 mg/dL (ref 70–99)
Potassium: 4.3 mmol/L (ref 3.5–5.2)
Sodium: 137 mmol/L (ref 134–144)
Total Protein: 7 g/dL (ref 6.0–8.5)
eGFR: 88 mL/min/{1.73_m2} (ref >59)

## 2023-05-22 LAB — HEMOGLOBIN A1C
Est. average glucose Bld gHb Est-mCnc: 105 mg/dL
Hgb A1c MFr Bld: 5.3 % (ref 4.8–5.6)

## 2023-05-22 LAB — LIPID PANEL
Chol/HDL Ratio: 2.6 ratio (ref 0.0–4.4)
Cholesterol, Total: 230 mg/dL — ABNORMAL HIGH (ref 100–199)
HDL: 89 mg/dL (ref >39)
LDL Chol Calc (NIH): 129 mg/dL — ABNORMAL HIGH (ref 0–99)
Triglycerides: 70 mg/dL (ref 0–149)
VLDL Cholesterol Cal: 12 mg/dL (ref 5–40)

## 2023-05-22 LAB — VITAMIN D 25 HYDROXY (VIT D DEFICIENCY, FRACTURES): Vit D, 25-Hydroxy: 26.2 ng/mL — ABNORMAL LOW (ref 30.0–100.0)

## 2023-05-22 LAB — TSH: TSH: 2.44 u[IU]/mL (ref 0.450–4.500)

## 2023-05-22 LAB — VITAMIN B12: Vitamin B-12: 411 pg/mL (ref 232–1245)

## 2023-05-25 ENCOUNTER — Encounter: Payer: Self-pay | Admitting: Nurse Practitioner

## 2023-06-24 ENCOUNTER — Encounter: Payer: Self-pay | Admitting: Podiatry

## 2023-06-24 ENCOUNTER — Ambulatory Visit: Admitting: Podiatry

## 2023-06-24 DIAGNOSIS — B351 Tinea unguium: Secondary | ICD-10-CM | POA: Diagnosis not present

## 2023-06-24 DIAGNOSIS — L603 Nail dystrophy: Secondary | ICD-10-CM | POA: Diagnosis not present

## 2023-06-24 NOTE — Patient Instructions (Addendum)
 Fungal Nail Infection A fungal nail infection is a common infection of the toenails or fingernails. This condition affects toenails more often than fingernails. It often affects the great, or big, toes. More than one nail may be infected. The condition can be passed from person to person (is contagious). What are the causes? This condition is caused by a fungus, such as yeast or molds. Several types of fungi can cause the infection. These fungi are common in moist and warm areas. If your hands or feet come into contact with the fungus, it may get into a crack in your fingernail or toenail or in the surrounding skin, and cause an infection. What increases the risk? The following factors may make you more likely to develop this condition: Being of older age. Having certain medical conditions, such as: Athlete's foot. Diabetes. Poor circulation. A weak body defense system (immune system). Walking barefoot in areas where the fungus thrives, such as showers or locker rooms. Wearing shoes and socks that cause your feet to sweat. Having a nail injury or a recent nail surgery. What are the signs or symptoms? Symptoms of this condition include: A pale spot on the nail. Thickening of the nail. A nail that becomes yellow, brown, or white. A brittle or ragged nail edge. A nail that has lifted away from the nail bed. How is this diagnosed? This condition is diagnosed with a physical exam. Your health care provider may take a scraping or clipping from your nail to test for the fungus. How is this treated? Treatment is not needed for mild infections. If you have significant nail changes, treatment may include: Antifungal medicines taken by mouth (orally). You may need to take the medicine for several weeks or several months, and you may not see the results for a long time. These medicines can cause side effects. Ask your health care provider what problems to watch for. Antifungal nail polish or nail  cream. These may be used along with oral antifungal medicines. Laser treatment of the nail. Surgery to remove the nail. This may be needed for the most severe infections. It can take a long time, usually up to a year, for the infection to go away. The infection may also come back. Follow these instructions at home: Medicines Take or apply over-the-counter and prescription medicines only as told by your health care provider. Ask your health care provider about using over-the-counter mentholated ointment on your nails. Nail care Trim your nails often. Wash and dry your hands and feet every day. Keep your feet dry. To do this: Wear absorbent socks, and change your socks frequently. Wear shoes that allow air to circulate, such as sandals or canvas tennis shoes. Throw out old shoes. If you go to a nail salon, make sure you choose one that uses clean instruments. Use antifungal foot powder on your feet and in your shoes. General instructions Do not share personal items, such as towels or nail clippers. Do not walk barefoot in shower rooms or locker rooms. Wear rubber gloves if you are working with your hands in wet areas. Keep all follow-up visits. This is important. Contact a health care provider if: You have redness, pain, or pus near the toenail or fingernail. Your infection is not getting better, or it is getting worse after several months. You have more circulation problems near the toenail or fingernail. You have brown or black discoloration of the nail that spreads to the surrounding skin. Summary A fungal nail infection is a common  infection of the toenails or fingernails. Treatment is not needed for mild infections. If you have significant nail changes, treatment may include taking medicine orally and applying medicine to your nails. It can take a long time, usually up to a year, for the infection to go away. The infection may also come back. Take or apply over-the-counter and  prescription medicines only as told by your health care provider. This information is not intended to replace advice given to you by your health care provider. Make sure you discuss any questions you have with your health care provider. Document Revised: 05/28/2020 Document Reviewed: 05/28/2020 Elsevier Patient Education  2024 [employer]  --   Terbinafine Tablets What is this medication? TERBINAFINE (TER bin a feen) treats fungal infections of the nails. It belongs to a group of medications called antifungals. It will not treat infections caused by bacteria or viruses. This medicine may be used for other purposes; ask your health care provider or pharmacist if you have questions. COMMON BRAND NAME(S): Lamisil, Terbinex What should I tell my care team before I take this medication? They need to know if you have any of these conditions: Liver disease An unusual or allergic reaction to terbinafine, other medications, foods, dyes, or preservatives Pregnant or trying to get pregnant Breast-feeding How should I use this medication? Take this medication by mouth with water. Take it as directed on the prescription label at the same time every day. You can take it with or without food. If it upsets your stomach, take it with food. Keep taking it unless your care team tells you to stop. A special MedGuide will be given to you by the pharmacist with each prescription and refill. Be sure to read this information carefully each time. Talk to your care team about the use of this medication in children. Special care may be needed. Overdosage: If you think you have taken too much of this medicine contact a poison control center or emergency room at once. NOTE: This medicine is only for you. Do not share this medicine with others. What if I miss a dose? If you miss a dose, take it as soon as you can unless it is more than 4 hours late. If it is more than 4 hours late, skip the missed dose. Take the next  dose at the normal time. What may interact with this medication? Do not take this medication with any of the following: Pimozide Thioridazine This medication may also interact with the following: Beta blockers Caffeine Certain medications for mental health conditions Cimetidine Cyclosporine Medications for fungal infections, such as fluconazole or ketoconazole Medications for irregular heartbeat, such as amiodarone, flecainide, propafenone Rifampin Warfarin This list may not describe all possible interactions. Give your health care provider a list of all the medicines, herbs, non-prescription drugs, or dietary supplements you use. Also tell them if you smoke, drink alcohol, or use illegal drugs. Some items may interact with your medicine. What should I watch for while using this medication? Visit your care team for regular checks on your progress. Tell your care team if your symptoms do not start to get better or if they get worse. This medication may cause serious skin reactions. They can happen weeks to months after starting the medication. Contact your care team right away if you notice fevers or flu-like symptoms with a rash. The rash may be red or purple and then turn into blisters or peeling of the skin. You may also notice a red rash with  swelling of the face, lips, or lymph nodes in your neck or under your arms. This medication can make you more sensitive to the sun. Keep out of the sun. If you cannot avoid being in the sun, wear protective clothing and sunscreen. Do not use sun lamps, tanning beds, or tanning booths. What side effects may I notice from receiving this medication? Side effects that you should report to your care team as soon as possible: Allergic reactions--skin rash, itching, hives, swelling of the face, lips, tongue, or throat Change in sense of smell Change in taste Infection--fever, chills, cough, or sore throat Liver injury--right upper belly pain, loss of  appetite, nausea, light-colored stool, dark yellow or brown urine, yellowing skin or eyes, unusual weakness or fatigue Low red blood cell level--unusual weakness or fatigue, dizziness, headache, trouble breathing Lupus-like syndrome--joint pain, swelling, or stiffness, butterfly-shaped rash on the face, rashes that get worse in the sun, fever, unusual weakness or fatigue Rash, fever, and swollen lymph nodes Redness, blistering, peeling, or loosening of the skin, including inside the mouth Unusual bruising or bleeding Worsening mood, feelings of depression Side effects that usually do not require medical attention (report to your care team if they continue or are bothersome): Diarrhea Gas Headache Nausea Stomach pain Upset stomach This list may not describe all possible side effects. Call your doctor for medical advice about side effects. You may report side effects to FDA at 1-800-FDA-1088. Where should I keep my medication? Keep out of the reach of children and pets. Store between 20 and 25 degrees C (68 and 77 degrees F). Protect from light. Get rid of any unused medication after the expiration date. To get rid of medications that are no longer needed or have expired: Take the medication to a medication take-back program. Check with your pharmacy or law enforcement to find a location. If you cannot return the medication, check the label or package insert to see if the medication should be thrown out in the garbage or flushed down the toilet. If you are not sure, ask your care team. If it is safe to put it in the trash, take the medication out of the container. Mix the medication with cat litter, dirt, coffee grounds, or other unwanted substance. Seal the mixture in a bag or container. Put it in the trash. NOTE: This sheet is a summary. It may not cover all possible information. If you have questions about this medicine, talk to your doctor, pharmacist, or health care provider.  2024  Elsevier/Gold Standard (2022-09-12 00:00:00)

## 2023-06-24 NOTE — Progress Notes (Signed)
  Subjective:  Patient ID: Kelsey [name], female    DOB: 09/19/1980,  MRN: [id number]  Chief Complaint  Patient presents with   Nail Problem    RM#11 Patient states over the past several months toe nails are growing to the side and thicker.    Discussed the use of AI scribe software for clinical note transcription with the patient, who gave verbal consent to proceed.  History of Present Illness The patient presents with toenails that have been growing sideways and thickening over the past few months. She denies pain, drainage, signs of infection, and pruritus. She has attempted to file the nails to correct the growth direction without success. The patient denies receiving pedicures or other nail treatments. She has noticed the nails becoming drier, but this does not affect the rest of the toes. The patient denies systemic symptoms and reports overall good health. She has no known history of diabetes or other chronic conditions.      Objective:    Physical Exam General: AAO x3, NAD  Dermatological: Toenails bilaterally particular the second third digit nails are hypertrophic, dystrophic with yellow, brown discoloration.  There is no edema, erythema or signs of infection the nails are starting to grow with a lateral deviation.  There is no open lesions.  Vascular: Dorsalis Pedis artery and Posterior Tibial artery pedal pulses are 2/4 bilateral with immedate capillary fill time. There is no pain with calf compression, swelling, warmth, erythema.   Neruologic: Grossly intact via light touch bilateral.   Musculoskeletal: No tenderness of exam today.   Gait: Unassisted, Nonantalgic.     No images are attached to the encounter.    Results    Assessment:   1. Onychomycosis      Plan:  Patient was evaluated and treated and all questions answered.  Assessment and Plan Assessment & Plan Onychomycosis Suspected onychomycosis causing thickening and deformation due to microtrauma  from shoe rubbing. - Trim and culture toenails to identify fungus. Culture sent to [facility].  - Discuss treatment options based on culture results, including oral terbinafine, topical treatments, and laser therapy. - Monitor liver function if oral terbinafine is prescribed. - Communicate culture results via MyChart within two weeks.  Return for nail culture results.   Charity Conch DPM

## 2023-07-09 ENCOUNTER — Encounter: Payer: Self-pay | Admitting: Podiatry

## 2023-07-20 ENCOUNTER — Other Ambulatory Visit: Payer: Self-pay | Admitting: Podiatry

## 2023-07-20 MED ORDER — EFINACONAZOLE 10 % EX SOLN
1.0000 [drp] | Freq: Every day | CUTANEOUS | 11 refills | Status: AC
Start: 2023-07-20 — End: ?

## 2023-07-29 ENCOUNTER — Other Ambulatory Visit: Payer: Self-pay | Admitting: Podiatry

## 2023-07-29 ENCOUNTER — Other Ambulatory Visit (HOSPITAL_COMMUNITY): Payer: Self-pay

## 2023-07-29 DIAGNOSIS — Z79899 Other long term (current) drug therapy: Secondary | ICD-10-CM

## 2023-07-29 MED ORDER — TERBINAFINE HCL 250 MG PO TABS
250.0000 mg | ORAL_TABLET | Freq: Every day | ORAL | 0 refills | Status: AC
Start: 2023-07-29 — End: ?
  Filled 2023-07-29 – 2023-08-11 (×2): qty 90, 90d supply, fill #0

## 2023-08-10 ENCOUNTER — Other Ambulatory Visit (HOSPITAL_COMMUNITY): Payer: Self-pay

## 2023-08-11 ENCOUNTER — Other Ambulatory Visit (HOSPITAL_COMMUNITY): Payer: Self-pay

## 2023-08-26 ENCOUNTER — Other Ambulatory Visit (HOSPITAL_COMMUNITY): Payer: Self-pay

## 2023-08-26 DIAGNOSIS — D1801 Hemangioma of skin and subcutaneous tissue: Secondary | ICD-10-CM | POA: Diagnosis not present

## 2023-08-26 DIAGNOSIS — D225 Melanocytic nevi of trunk: Secondary | ICD-10-CM | POA: Diagnosis not present

## 2023-08-26 DIAGNOSIS — L814 Other melanin hyperpigmentation: Secondary | ICD-10-CM | POA: Diagnosis not present

## 2023-08-26 DIAGNOSIS — D2372 Other benign neoplasm of skin of left lower limb, including hip: Secondary | ICD-10-CM | POA: Diagnosis not present

## 2023-08-26 DIAGNOSIS — D2262 Melanocytic nevi of left upper limb, including shoulder: Secondary | ICD-10-CM | POA: Diagnosis not present

## 2023-08-26 DIAGNOSIS — L820 Inflamed seborrheic keratosis: Secondary | ICD-10-CM | POA: Diagnosis not present

## 2023-08-26 DIAGNOSIS — L821 Other seborrheic keratosis: Secondary | ICD-10-CM | POA: Diagnosis not present

## 2023-08-26 DIAGNOSIS — D2272 Melanocytic nevi of left lower limb, including hip: Secondary | ICD-10-CM | POA: Diagnosis not present

## 2023-08-26 DIAGNOSIS — L918 Other hypertrophic disorders of the skin: Secondary | ICD-10-CM | POA: Diagnosis not present

## 2023-08-26 DIAGNOSIS — D2261 Melanocytic nevi of right upper limb, including shoulder: Secondary | ICD-10-CM | POA: Diagnosis not present

## 2023-08-26 MED ORDER — AZELAIC ACID 15 % EX GEL
CUTANEOUS | 4 refills | Status: AC
  Filled 2023-08-26: qty 50, 30d supply, fill #0
  Filled 2023-09-23: qty 50, 30d supply, fill #1

## 2023-09-23 ENCOUNTER — Other Ambulatory Visit: Payer: Self-pay

## 2023-09-23 ENCOUNTER — Other Ambulatory Visit: Payer: Self-pay | Admitting: Nurse Practitioner

## 2023-09-23 DIAGNOSIS — Z6834 Body mass index (BMI) 34.0-34.9, adult: Secondary | ICD-10-CM

## 2023-09-23 NOTE — Telephone Encounter (Signed)
 [facility] would not fill sending back to you.

## 2023-09-23 NOTE — Telephone Encounter (Signed)
 Last apt 05/21/23.

## 2023-09-28 ENCOUNTER — Other Ambulatory Visit (HOSPITAL_COMMUNITY): Payer: Self-pay

## 2023-09-28 MED ORDER — PHENTERMINE HCL 37.5 MG PO TABS
37.5000 mg | ORAL_TABLET | Freq: Every day | ORAL | 0 refills | Status: AC
  Filled 2023-09-28: qty 90, 90d supply, fill #0

## 2024-05-30 ENCOUNTER — Encounter: Payer: Self-pay | Admitting: Nurse Practitioner
